# Patient Record
Sex: Female | Born: 1950 | Race: White | Hispanic: No | Smoking: Former smoker
Health system: Southern US, Community
[De-identification: ages and names within clinical notes are randomized; demographics above are authoritative.]

## PROBLEM LIST (undated history)

## (undated) DIAGNOSIS — E785 Hyperlipidemia, unspecified: Secondary | ICD-10-CM

## (undated) DIAGNOSIS — M810 Age-related osteoporosis without current pathological fracture: Secondary | ICD-10-CM

## (undated) DIAGNOSIS — Z87828 Personal history of other (healed) physical injury and trauma: Secondary | ICD-10-CM

## (undated) DIAGNOSIS — H269 Unspecified cataract: Secondary | ICD-10-CM

## (undated) DIAGNOSIS — E079 Disorder of thyroid, unspecified: Secondary | ICD-10-CM

## (undated) DIAGNOSIS — M1990 Unspecified osteoarthritis, unspecified site: Secondary | ICD-10-CM

## (undated) DIAGNOSIS — K219 Gastro-esophageal reflux disease without esophagitis: Secondary | ICD-10-CM

## (undated) DIAGNOSIS — T7840XA Allergy, unspecified, initial encounter: Secondary | ICD-10-CM

## (undated) HISTORY — PX: OTHER SURGICAL HISTORY: SHX169

## (undated) HISTORY — DX: Unspecified cataract: H26.9

## (undated) HISTORY — DX: Gastro-esophageal reflux disease without esophagitis: K21.9

## (undated) HISTORY — PX: POLYPECTOMY: SHX149

## (undated) HISTORY — DX: Allergy, unspecified, initial encounter: T78.40XA

## (undated) HISTORY — PX: CATARACT EXTRACTION: SUR2

## (undated) HISTORY — DX: Unspecified osteoarthritis, unspecified site: M19.90

## (undated) HISTORY — DX: Personal history of other (healed) physical injury and trauma: Z87.828

## (undated) HISTORY — DX: Age-related osteoporosis without current pathological fracture: M81.0

## (undated) HISTORY — PX: COLONOSCOPY: SHX174

## (undated) HISTORY — DX: Hyperlipidemia, unspecified: E78.5

## (undated) HISTORY — DX: Disorder of thyroid, unspecified: E07.9

---

## 1976-05-09 HISTORY — PX: BREAST ENHANCEMENT SURGERY: SHX7

## 1998-05-09 HISTORY — PX: KNEE SURGERY: SHX244

## 2000-01-14 ENCOUNTER — Encounter: Admission: RE | Admit: 2000-01-14 | Discharge: 2000-01-14 | Payer: Self-pay | Admitting: Surgery

## 2000-01-14 ENCOUNTER — Encounter: Payer: Self-pay | Admitting: Surgery

## 2000-01-17 ENCOUNTER — Ambulatory Visit (HOSPITAL_BASED_OUTPATIENT_CLINIC_OR_DEPARTMENT_OTHER): Admission: RE | Admit: 2000-01-17 | Discharge: 2000-01-17 | Payer: Self-pay | Admitting: Surgery

## 2002-06-17 ENCOUNTER — Encounter: Payer: Self-pay | Admitting: *Deleted

## 2002-06-17 ENCOUNTER — Ambulatory Visit (HOSPITAL_COMMUNITY): Admission: RE | Admit: 2002-06-17 | Discharge: 2002-06-17 | Payer: Self-pay | Admitting: *Deleted

## 2003-03-04 ENCOUNTER — Other Ambulatory Visit: Admission: RE | Admit: 2003-03-04 | Discharge: 2003-03-04 | Payer: Self-pay | Admitting: Obstetrics and Gynecology

## 2003-06-20 ENCOUNTER — Ambulatory Visit (HOSPITAL_BASED_OUTPATIENT_CLINIC_OR_DEPARTMENT_OTHER): Admission: RE | Admit: 2003-06-20 | Discharge: 2003-06-20 | Payer: Self-pay | Admitting: Surgery

## 2003-06-20 ENCOUNTER — Ambulatory Visit (HOSPITAL_COMMUNITY): Admission: RE | Admit: 2003-06-20 | Discharge: 2003-06-20 | Payer: Self-pay | Admitting: Surgery

## 2006-02-08 ENCOUNTER — Other Ambulatory Visit: Admission: RE | Admit: 2006-02-08 | Discharge: 2006-02-08 | Payer: Self-pay | Admitting: *Deleted

## 2012-06-19 ENCOUNTER — Other Ambulatory Visit: Payer: Self-pay | Admitting: Family Medicine

## 2012-06-19 ENCOUNTER — Ambulatory Visit
Admission: RE | Admit: 2012-06-19 | Discharge: 2012-06-19 | Disposition: A | Discharge status: Home / Self Care | Payer: 59 | Source: Ambulatory Visit | Attending: Family Medicine | Admitting: Family Medicine

## 2012-06-19 DIAGNOSIS — M25579 Pain in unspecified ankle and joints of unspecified foot: Secondary | ICD-10-CM

## 2013-04-03 ENCOUNTER — Other Ambulatory Visit (HOSPITAL_COMMUNITY)
Admission: RE | Admit: 2013-04-03 | Discharge: 2013-04-03 | Disposition: A | Discharge status: Home / Self Care | Payer: 59 | Source: Ambulatory Visit | Attending: Family Medicine | Admitting: Family Medicine

## 2013-04-03 ENCOUNTER — Other Ambulatory Visit: Payer: Self-pay | Admitting: Family Medicine

## 2013-04-03 DIAGNOSIS — Z0000 Encounter for general adult medical examination without abnormal findings: Secondary | ICD-10-CM | POA: Insufficient documentation

## 2013-04-08 ENCOUNTER — Encounter: Payer: Self-pay | Admitting: Gastroenterology

## 2013-05-30 ENCOUNTER — Encounter: Payer: Self-pay | Admitting: Gastroenterology

## 2013-05-30 ENCOUNTER — Ambulatory Visit (AMBULATORY_SURGERY_CENTER): Payer: Self-pay | Admitting: *Deleted

## 2013-05-30 VITALS — Ht 62.0 in | Wt 167.0 lb

## 2013-05-30 DIAGNOSIS — Z1211 Encounter for screening for malignant neoplasm of colon: Secondary | ICD-10-CM

## 2013-05-30 MED ORDER — MOVIPREP 100 G PO SOLR: 1.0000 | Freq: Once | ORAL | Status: DC

## 2013-05-30 NOTE — Progress Notes (Signed)
Denies allergies to eggs or soy products. Denies complications with sedation or anesthesia. 

## 2013-06-14 ENCOUNTER — Emergency Department (HOSPITAL_COMMUNITY)
Admission: EM | Admit: 2013-06-14 | Discharge: 2013-06-14 | Disposition: A | Discharge status: Home / Self Care | Payer: 59 | Attending: Emergency Medicine | Admitting: Emergency Medicine

## 2013-06-14 ENCOUNTER — Encounter: Payer: Self-pay | Admitting: Gastroenterology

## 2013-06-14 ENCOUNTER — Telehealth: Payer: Self-pay | Admitting: Gastroenterology

## 2013-06-14 ENCOUNTER — Encounter (HOSPITAL_COMMUNITY): Payer: Self-pay | Admitting: Emergency Medicine

## 2013-06-14 ENCOUNTER — Encounter (HOSPITAL_COMMUNITY)
Admission: EM | Disposition: A | Discharge status: Home / Self Care | Payer: 59 | Source: Home / Self Care | Attending: Emergency Medicine

## 2013-06-14 ENCOUNTER — Ambulatory Visit (AMBULATORY_SURGERY_CENTER): Payer: 59 | Admitting: Gastroenterology

## 2013-06-14 VITALS — BP 135/77 | HR 79 | Temp 96.4°F | Resp 18 | Ht 62.0 in | Wt 167.0 lb

## 2013-06-14 DIAGNOSIS — E785 Hyperlipidemia, unspecified: Secondary | ICD-10-CM | POA: Insufficient documentation

## 2013-06-14 DIAGNOSIS — D126 Benign neoplasm of colon, unspecified: Secondary | ICD-10-CM

## 2013-06-14 DIAGNOSIS — Z1211 Encounter for screening for malignant neoplasm of colon: Secondary | ICD-10-CM

## 2013-06-14 DIAGNOSIS — Z7982 Long term (current) use of aspirin: Secondary | ICD-10-CM | POA: Insufficient documentation

## 2013-06-14 DIAGNOSIS — Y849 Medical procedure, unspecified as the cause of abnormal reaction of the patient, or of later complication, without mention of misadventure at the time of the procedure: Secondary | ICD-10-CM | POA: Insufficient documentation

## 2013-06-14 DIAGNOSIS — Z87891 Personal history of nicotine dependence: Secondary | ICD-10-CM | POA: Insufficient documentation

## 2013-06-14 DIAGNOSIS — IMO0002 Reserved for concepts with insufficient information to code with codable children: Secondary | ICD-10-CM

## 2013-06-14 DIAGNOSIS — K625 Hemorrhage of anus and rectum: Secondary | ICD-10-CM

## 2013-06-14 HISTORY — PX: FLEXIBLE SIGMOIDOSCOPY: SHX5431

## 2013-06-14 LAB — CBC WITH DIFFERENTIAL/PLATELET
Basophils Absolute: 0.1 10*3/uL (ref 0.0–0.1)
Basophils Relative: 1 % (ref 0–1)
EOS ABS: 0.2 10*3/uL (ref 0.0–0.7)
Eosinophils Relative: 2 % (ref 0–5)
HCT: 39.8 % (ref 36.0–46.0)
Hemoglobin: 13.4 g/dL (ref 12.0–15.0)
LYMPHS ABS: 1.9 10*3/uL (ref 0.7–4.0)
LYMPHS PCT: 30 % (ref 12–46)
MCH: 31.6 pg (ref 26.0–34.0)
MCHC: 33.7 g/dL (ref 30.0–36.0)
MCV: 93.9 fL (ref 78.0–100.0)
MONOS PCT: 5 % (ref 3–12)
Monocytes Absolute: 0.3 10*3/uL (ref 0.1–1.0)
NEUTROS ABS: 4 10*3/uL (ref 1.7–7.7)
Neutrophils Relative %: 62 % (ref 43–77)
PLATELETS: 214 10*3/uL (ref 150–400)
RBC: 4.24 MIL/uL (ref 3.87–5.11)
RDW: 12.7 % (ref 11.5–15.5)
WBC: 6.4 10*3/uL (ref 4.0–10.5)

## 2013-06-14 LAB — BASIC METABOLIC PANEL
BUN: 8 mg/dL (ref 6–23)
CO2: 21 mEq/L (ref 19–32)
Calcium: 8.7 mg/dL (ref 8.4–10.5)
Chloride: 109 mEq/L (ref 96–112)
Creatinine, Ser: 0.65 mg/dL (ref 0.50–1.10)
GFR calc non Af Amer: >90 mL/min (ref >90)
GLUCOSE: 134 mg/dL — AB (ref 70–99)
POTASSIUM: 4 meq/L (ref 3.7–5.3)
SODIUM: 143 meq/L (ref 137–147)

## 2013-06-14 SURGERY — SIGMOIDOSCOPY, FLEXIBLE
Anesthesia: Moderate Sedation

## 2013-06-14 MED ORDER — SODIUM CHLORIDE 0.9 % IV SOLN: INTRAVENOUS | Status: DC

## 2013-06-14 MED ORDER — FLEET ENEMA 7-19 GM/118ML RE ENEM: 1.0000 | ENEMA | Freq: Once | RECTAL | Status: DC

## 2013-06-14 MED ORDER — SODIUM CHLORIDE 0.9 % IV SOLN: 500.0000 mL | INTRAVENOUS | Status: DC

## 2013-06-14 MED ORDER — FLEET ENEMA 7-19 GM/118ML RE ENEM
ENEMA | RECTAL | Status: AC
  Filled 2013-06-14: qty 1

## 2013-06-14 NOTE — Progress Notes (Signed)
Called to room to assist during endoscopic procedure.  Patient ID and intended procedure confirmed with present staff. Received instructions for my participation in the procedure from the performing physician.  

## 2013-06-14 NOTE — ED Provider Notes (Signed)
CSN: [id number]     Arrival date & time 06/14/13  1140 History   First MD Initiated Contact with Patient 06/14/13 1149     Chief Complaint  Patient presents with  . Rectal Bleeding   (Consider location/radiation/quality/duration/timing/severity/associated sxs/prior Treatment) HPI Patient reports she had a routine colonoscopy done at 9 AM this morning. She states she's never had one before. She did not have any precipitating problems. She states she had 4 polyps removed. She states when she went home she had the urge to have a bowel movement and passed a lot of liquid however it was grossly bloody. She did pass blood 2 more times after that. She has some mild lower abdominal bloating but denies pain. She denies nausea, vomiting, feeling dizzy, feeling lightheaded. She states she's never had rectal bleeding before.  PCP Dr Earney Mallet GI Dr Fuller Plan  Past Medical History  Diagnosis Date  . Hyperlipidemia   . 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 Breast enhancement surgery  1978  . Knee surgery Right 2000   Family History  Problem Relation Age of Onset  . Colon cancer Neg Hx   . Esophageal cancer Neg Hx   . Rectal cancer Neg Hx   . Stomach cancer Neg Hx    History  Substance Use Topics  . Smoking status: Former Smoker    Quit date: 05/09/1990  . Smokeless tobacco: Never Used  . Alcohol Use: 0.6 oz/week    1 Cans of beer per week   patient drinks 3-4 beers per week however she has not had any in 2 weeks Patient employed   OB History   Grav Para Term Preterm Abortions TAB SAB Ect Mult Living                 Review of Systems  All other systems reviewed and are negative.    Allergies  Review of patient's allergies indicates no known allergies.  Home Medications   Current Outpatient Rx  Name  Route  Sig  Dispense  Refill  . ascorbic acid (VITAMIN C) 1000 MG tablet   Oral   Take 1,000 mg by mouth daily.         Marland Kitchen aspirin 81  MG tablet   Oral   Take 81 mg by mouth daily.         Marland Kitchen atorvastatin (LIPITOR) 20 MG tablet   Oral   Take 20 mg by mouth daily at 6 PM.          . doxylamine, Sleep, (UNISOM) 25 MG tablet   Oral   Take 25 mg by mouth at bedtime.          . gabapentin (NEURONTIN) 300 MG capsule   Oral   Take 300 mg by mouth 3 (three) times daily.         Marland Kitchen omeprazole (PRILOSEC) 20 MG capsule   Oral   Take 20 mg by mouth daily.          BP 147/80  Pulse 85  Temp(Src) 97.7 F (36.5 C) (Oral)  Resp 14  SpO2 96%  Vital signs normal   Orthostatic vital signs are normal  Physical Exam  Nursing note and vitals reviewed. Constitutional: She is oriented to person, place, and time. She appears well-developed and well-nourished.  Non-toxic appearance. She does not appear ill. No distress.  HENT:  Head: Normocephalic and atraumatic.  Right Ear: External ear normal.  Left  Ear: External ear normal.  Nose: Nose normal. No mucosal edema or rhinorrhea.  Mouth/Throat: Oropharynx is clear and moist and mucous membranes are normal. No dental abscesses or uvula swelling.  Eyes: Conjunctivae and EOM are normal. Pupils are equal, round, and reactive to light.  Conjunctiva are not pale  Neck: Normal range of motion and full passive range of motion without pain. Neck supple.  Cardiovascular: Normal rate, regular rhythm and normal heart sounds.  Exam reveals no gallop and no friction rub.   No murmur heard. Pulmonary/Chest: Effort normal and breath sounds normal. No respiratory distress. She has no wheezes. She has no rhonchi. She has no rales. She exhibits no tenderness and no crepitus.  Abdominal: Soft. Normal appearance and bowel sounds are normal. She exhibits no distension. There is no tenderness. There is no rebound and no guarding.  Genitourinary:  Rectal done by GI PA is grossly bloody  Musculoskeletal: Normal range of motion. She exhibits no edema and no tenderness.  Moves all extremities  well.   Neurological: She is alert and oriented to person, place, and time. She has normal strength. No cranial nerve deficit.  Skin: Skin is warm, dry and intact. No rash noted. No erythema. No pallor.  Psychiatric: She has a normal mood and affect. Her speech is normal and behavior is normal. Her mood appears not anxious.    ED Course  Procedures (including critical care time)  Pt seen by the GI PA, they are going to take her to the OR to clip the site of the rectal polyp that was removed earlier today, which they feel is the most likely site of the bleeding.   Labs Review Results for orders placed during the hospital encounter of 06/14/13  CBC WITH DIFFERENTIAL      Result Value Range   WBC 6.4  4.0 - 10.5 K/uL   RBC 4.24  3.87 - 5.11 MIL/uL   Hemoglobin 13.4  12.0 - 15.0 g/dL   HCT 39.8  36.0 - 46.0 %   MCV 93.9  78.0 - 100.0 fL   MCH 31.6  26.0 - 34.0 pg   MCHC 33.7  30.0 - 36.0 g/dL   RDW 12.7  11.5 - 15.5 %   Platelets 214  150 - 400 K/uL   Neutrophils Relative % 62  43 - 77 %   Neutro Abs 4.0  1.7 - 7.7 K/uL   Lymphocytes Relative 30  12 - 46 %   Lymphs Abs 1.9  0.7 - 4.0 K/uL   Monocytes Relative 5  3 - 12 %   Monocytes Absolute 0.3  0.1 - 1.0 K/uL   Eosinophils Relative 2  0 - 5 %   Eosinophils Absolute 0.2  0.0 - 0.7 K/uL   Basophils Relative 1  0 - 1 %   Basophils Absolute 0.1  0.0 - 0.1 K/uL  BASIC METABOLIC PANEL      Result Value Range   Sodium 143  137 - 147 mEq/L   Potassium 4.0  3.7 - 5.3 mEq/L   Chloride 109  96 - 112 mEq/L   CO2 21  19 - 32 mEq/L   Glucose, Bld 134 (*) 70 - 99 mg/dL   BUN 8  6 - 23 mg/dL   Creatinine, Ser 0.65  0.50 - 1.10 mg/dL   Calcium 8.7  8.4 - 10.5 mg/dL   GFR calc non Af Amer >90  >90 mL/min   GFR calc Af Amer >90  >90 mL/min  Laboratory interpretation all normal    Imaging Review No results found.  EKG Interpretation   None       MDM   1. Post-op bleeding   2. Bright red rectal bleeding     Disposition per  GI   Rolland Porter, MD, [facility]    Janice Norrie, MD 06/14/13 [phone]

## 2013-06-14 NOTE — Telephone Encounter (Signed)
Patient stated that she has had a lot of bleeding from her rectum when she goes to the bathroom.   She said that the toilet was full of blood.  Dr. Fuller Plan wants her to go to [facility] ER.  Nevin Bloodgood will see her.

## 2013-06-14 NOTE — Consult Note (Signed)
Consultation  Referring Provider: Emergency Department      Primary Care Physician:  Shirline Frees, MD Primary Gastroenterologist:  Freda Jackson       Reason for Consultation: rectal bleeding             HPI:   Mikayla Golden is a 63 y.o. female who is s/p screening colonoscopy with polyppectomy this am by Dr. Fuller Plan. Three small, sessile polyps were removed from descending colon via cold snare  / cold forceps. An 71mm semi-pedunculated was removed from rectum with cold snare. Shortly after arriving home post-procedure patient passed a large amount of bright red blood. She has since had two more episodes. No abdominal pain. No SOB or dizziness. Vitals stable. She takes a baby asa, no other NSAIDs. No blood thinner use.  Past Medical History  Diagnosis Date  . Hyperlipidemia   . 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 Breast enhancement surgery  1978  . Knee surgery Right 2000    Family History  Problem Relation Age of Onset  . Colon cancer Neg Hx   . Esophageal cancer Neg Hx   . Rectal cancer Neg Hx   . Stomach cancer Neg Hx      History  Substance Use Topics  . Smoking status: Former Smoker    Quit date: 05/09/1990  . Smokeless tobacco: Never Used  . Alcohol Use: 0.6 oz/week    1 Cans of beer per week    Prior to Admission medications   Medication Sig Start Date End Date Taking? Authorizing Provider  ascorbic acid (VITAMIN C) 1000 MG tablet Take 1,000 mg by mouth daily.   Yes Historical Provider, MD  aspirin 81 MG tablet Take 81 mg by mouth daily.   Yes Historical Provider, MD  atorvastatin (LIPITOR) 20 MG tablet Take 20 mg by mouth daily at 6 PM.    Yes Historical Provider, MD  doxylamine, Sleep, (UNISOM) 25 MG tablet Take 25 mg by mouth at bedtime.    Yes Historical Provider, MD  gabapentin (NEURONTIN) 300 MG capsule Take 300 mg by mouth 3 (three) times daily.   Yes Historical Provider, MD  omeprazole (PRILOSEC) 20  MG capsule Take 20 mg by mouth daily.   Yes Historical Provider, MD    No current facility-administered medications for this encounter.   Current Outpatient Prescriptions  Medication Sig Dispense Refill  . ascorbic acid (VITAMIN C) 1000 MG tablet Take 1,000 mg by mouth daily.      Marland Kitchen aspirin 81 MG tablet Take 81 mg by mouth daily.      Marland Kitchen atorvastatin (LIPITOR) 20 MG tablet Take 20 mg by mouth daily at 6 PM.       . doxylamine, Sleep, (UNISOM) 25 MG tablet Take 25 mg by mouth at bedtime.       . gabapentin (NEURONTIN) 300 MG capsule Take 300 mg by mouth 3 (three) times daily.      Marland Kitchen omeprazole (PRILOSEC) 20 MG capsule Take 20 mg by mouth daily.        Allergies as of 06/14/2013  . (No Known Allergies)    Review of Systems:    All systems reviewed and negative except where noted in HPI.   Physical Exam:  Vital signs in last 24 hours: Temp:  [96.4 F (35.8 C)-97.7 F (36.5 C)] 97.7 F (36.5 C) (02/06 1150) Pulse Rate:  [78-94] 94 (02/06 1150) Resp:  [12-21]  14 (02/06 1150) BP: (114-146)/(69-98) 137/87 mmHg (02/06 1150) SpO2:  [92 %-100 %] 96 % (02/06 1150) Weight:  [167 lb (75.751 kg)] 167 lb (75.751 kg) (02/06 0825)   General:   Pleasant white female in NAD Head:  Normocephalic and atraumatic. Eyes:   No icterus.   Conjunctiva pink. Ears:  Normal auditory acuity. Neck:  Supple; no masses felt Lungs:  Respirations even and unlabored. Lungs clear to auscultation bilaterally.   No wheezes, crackles, or rhonchi.  Heart:  Regular rate and rhythm;  murmur heard. Abdomen:  Soft, nondistended, nontender. Normal bowel sounds. No appreciable masses or hepatomegaly.  Rectal:  Small amount bright red blood on DRE.   Msk:  Symmetrical without gross deformities.  Extremities:  Without edema. Neurologic:  Alert and  oriented x4;  grossly normal neurologically. Skin:  Intact without significant lesions or rashes. Cervical Nodes:  No significant cervical adenopathy. Psych:  Alert and  cooperative. Normal affect.  LAB RESULTS:  Recent Labs  06/14/13 1205  WBC 6.4  HGB 13.4  HCT 39.8  PLT 214    PREVIOUS ENDOSCOPIES:            Colonoscopy with polypectomy this am, see HPI   Impression / Plan:   63 year old female presenting with painless hematochezia following colonoscopy with polypectomy this am.. Suspect post-polypectomy bleed. She is hemodynamically stable, presenting hgb 14.2. Culprit lesions likely the 23mm semi-pedunculated rectal polyp though it was removed with cold snare. Will likely need flexible sigmoidoscopy today for further evaluation and treatment. Keep NPO.   Thanks   LOS: 0 days   [name]  06/14/2013, 12:40 PM

## 2013-06-14 NOTE — Progress Notes (Signed)
Procedure ends, to recovery, report given and VSS. 

## 2013-06-14 NOTE — Consult Note (Signed)
Patient seen, examined, and I agree with the above documentation, including the assessment and plan. Proceed to unpreppred flex sig for control of post-polypectomy bleeding The nature of the procedure, as well as the risks, benefits, and alternatives were carefully and thoroughly reviewed with the patient. Ample time for discussion and questions allowed. The patient understood, was satisfied, and agreed to proceed.

## 2013-06-14 NOTE — ED Notes (Signed)
Pt had colonoscopy at 9am; started having rectal bleeding when she got home; told to come to ER by Dr Johny Shock; minimal abd pain

## 2013-06-14 NOTE — Op Note (Signed)
[place]  [address] [place], [id number]   COLONOSCOPY PROCEDURE REPORT  PATIENT: Mikayla, Golden  MR#: [id number] BIRTHDATE: [birth year] , 62  yrs. old GENDER: Female ENDOSCOPIST: Ladene Artist, MD, [facility] REFERRED IR:SWNIOEV Kenton Kingfisher, M.D. PROCEDURE DATE:  06/14/2013 PROCEDURE:   Colonoscopy with biopsy and snare polypectomy First Screening Colonoscopy - Avg.  risk and is 50 yrs.  old or older Yes.  Prior Negative Screening - Now for repeat screening. N/A  History of Adenoma - Now for follow-up colonoscopy & has been > or = to 3 yrs.  N/A  Polyps Removed Today? Yes. ASA CLASS:   Class II INDICATIONS:average risk screening. MEDICATIONS: MAC sedation, administered by CRNA and propofol (Diprivan) 230mg  IV DESCRIPTION OF PROCEDURE:   After the risks benefits and alternatives of the procedure were thoroughly explained, informed consent was obtained.  A digital rectal exam revealed no abnormalities of the rectum.   The LB [id number]  endoscope was introduced through the anus and advanced to the cecum, which was identified by both the appendix and ileocecal valve. No adverse events experienced.   The quality of the prep was excellent, using MoviPrep  The instrument was then slowly withdrawn as the colon was fully examined.  COLON FINDINGS: Three sessile polyps ranging between 3-5 mm in size were found in the descending colon.  A polypectomy was performed with a cold snare and with cold forceps.  The resection was complete and the polyp tissue was completely retrieved.   A semi-pedunculated polyp measuring 8 mm in size was found in the rectum.  A polypectomy was performed with a cold snare.  The resection was complete and the polyp tissue was completely retrieved.   The colon was otherwise normal.  There was no diverticulosis, inflammation, polyps or cancers unless previously stated.  Retroflexed views revealed no abnormalities. The time to cecum=2 minutes  59 seconds.  Withdrawal time=11 minutes 29 seconds. The scope was withdrawn and the procedure completed. COMPLICATIONS: There were no complications.  ENDOSCOPIC IMPRESSION: 1.   Three sessile polyps ranging between 3-51mm in the descending colon; polypectomy performed with a cold snare and with cold forceps 2.   Semi-pedunculated polyp measuring 8 mm in the rectum; polypectomy performed with a cold snare  RECOMMENDATIONS: 1.  Await pathology results 2.  Repeat colonoscopy in 5 years if polyp(s) adenomatous; otherwise 10 years  eSigned:  Ladene Artist, MD, [facility] 06/14/2013 9:37 AM

## 2013-06-14 NOTE — Discharge Instructions (Addendum)
Flexible Sigmoidoscopy Your caregiver has ordered a flexible sigmoidoscopy. This is an exam to evaluate your lower colon. In this exam your colon is cleansed and a short fiber optic tube is inserted through your rectum and into your colon. The fiber optic scope (endoscope) is a short bundle of enclosed flexible small glass fibers. It transmits light to the area examined and images from that area to your caregiver. You do not have to worry about glass breakage in the endoscope. Discomfort is usually minimal. Sedatives and pain medications are generally not required. This exam helps to detect tumors (lumps), polyps, inflammation (swelling and soreness), and areas of bleeding. It may also be used to take biopsies. These are small pieces of tissue taken to examine under a microscope. LET YOUR CAREGIVER KNOW ABOUT:  Allergies.  Medications taken including herbs, eye drops, over the counter medications, and creams.  Use of steroids (by mouth or creams).  Previous problems with anesthetics or novocaine  Possibility of pregnancy, if this applies.  History of blood clots (thrombophlebitis).  History of bleeding or blood problems.  Previous surgery.  Other health problems. BEFORE THE PROCEDURE Eat normally the night before the exam. Your caregiver may order a mild enema or laxative the night before. No eating or drinking should occur after midnight until the procedure is completed. A rectal suppository or enemas may be given in the morning prior to your procedure. You will be brought to the examination area in a hospital gown. You should be present 60 minutes prior to your procedure or as directed.  AFTER THE PROCEDURE   There is sometimes a little blood passed with the first bowel movement. Do not be concerned. Because air is often used during the exam, it is not unusual to pass gas and experience abdominal (belly) cramping. Walking or a warm pack on your abdomen may help with this. Do not sleep  with a heating pad as burns can occur.  You may resume all normal eating and activities.  Only take over-the-counter or prescription medicines for pain, discomfort, or fever as directed by your caregiver. Do not use aspirin or blood thinners if a biopsy (tissue sample) was taken. Consult your caregiver for medication usage if biopsies were taken.  Call for your results as instructed by your caregiver. Remember, it is your responsibility to obtain the results of your biopsy. Do not assume everything is fine because you do not hear from your caregiver. SEEK IMMEDIATE MEDICAL CARE IF:  An oral temperature above 102 F (38.9 C) develops.  You pass large blood clots or fill a toilet with blood following the procedure. This may also occur 10 to 14 days following the procedure. It is more likely if a biopsy was taken.  You develop abdominal pain not relieved with medication or that is getting worse rather than better. Document Released: 04/22/2000 Document Revised: 07/18/2011 Document Reviewed: 02/02/2005 Exit[employer] Patient Information 2014 [employer]   YOU HAD AN ENDOSCOPIC PROCEDURE TODAY: Refer to the procedure report that was given to you for any specific questions about what was found during the examination.  If the procedure report does not answer your questions, please call your gastroenterologist to clarify.  YOU SHOULD EXPECT: Some feelings of bloating in the abdomen. Passage of more gas than usual.  Walking can help get rid of the air that was put into your GI tract during the procedure and reduce the bloating. If you had a lower endoscopy (such as a colonoscopy or flexible sigmoidoscopy) you  may notice spotting of blood in your stool or on the toilet paper.   DIET: Your first meal following the procedure should be a light meal and then it is ok to progress to your normal diet.  A half-sandwich or bowl of soup is an example of a good first meal.  Heavy or fried foods are harder to  digest and may make you feel nasueas or bloated.  Drink plenty of fluids but you should avoid alcoholic beverages for 24 hours.  ACTIVITY: Your care partner should take you home directly after the procedure.  You should plan to take it easy, moving slowly for the rest of the day.  You can resume normal activity the day after the procedure however you should NOT DRIVE or use heavy machinery for 24 hours (because of the sedation medicines used during the test).    SYMPTOMS TO REPORT IMMEDIATELY  A gastroenterologist can be reached at any hour.  Please call your doctor's office for any of the following symptoms:   Following lower endoscopy (colonoscopy, flexible sigmoidoscopy)  Excessive amounts of blood in the stool  Significant tenderness, worsening of abdominal pains  Swelling of the abdomen that is new, acute  Fever of 100 or higher  Following upper endoscopy (EGD, EUS, ERCP)  Vomiting of blood or coffee ground material  New, significant abdominal pain  New, significant chest pain or pain under the shoulder blades  Painful or persistently difficult swallowing  New shortness of breath  Black, tarry-looking stools  FOLLOW UP: If any biopsies were taken you will be contacted by phone or by letter within the next 1-3 weeks.  Call your gastroenterologist if you have not heard about the biopsies in 3 weeks.  Please also call your gastroenterologist's office with any specific questions about appointments or follow up tests.

## 2013-06-14 NOTE — Patient Instructions (Signed)
YOU HAD AN ENDOSCOPIC PROCEDURE TODAY AT [facility]: Refer to the procedure report that was given to you for any specific questions about what was found during the examination.  If the procedure report does not answer your questions, please call your gastroenterologist to clarify.  If you requested that your care partner not be given the details of your procedure findings, then the procedure report has been included in a sealed envelope for you to review at your convenience later.  YOU SHOULD EXPECT: Some feelings of bloating in the abdomen. Passage of more gas than usual.  Walking can help get rid of the air that was put into your GI tract during the procedure and reduce the bloating. If you had a lower endoscopy (such as a colonoscopy or flexible sigmoidoscopy) you may notice spotting of blood in your stool or on the toilet paper. If you underwent a bowel prep for your procedure, then you may not have a normal bowel movement for a few days.  DIET: Your first meal following the procedure should be a light meal and then it is ok to progress to your normal diet.  A half-sandwich or bowl of soup is an example of a good first meal.  Heavy or fried foods are harder to digest and may make you feel nauseous or bloated.  Likewise meals heavy in dairy and vegetables can cause extra gas to form and this can also increase the bloating.  Drink plenty of fluids but you should avoid alcoholic beverages for 24 hours.  iNCREASE THE FIBER IN YOUR DIET.  ACTIVITY: Your care partner should take you home directly after the procedure.  You should plan to take it easy, moving slowly for the rest of the day.  You can resume normal activity the day after the procedure however you should NOT DRIVE or use heavy machinery for 24 hours (because of the sedation medicines used during the test).    SYMPTOMS TO REPORT IMMEDIATELY: A gastroenterologist can be reached at any hour.  During normal business hours, 8:30 AM to  5:00 PM Monday through Friday, call [phone].  After hours and on weekends, please call the GI answering service at [phone] who will take a message and have the physician on call contact you.   Following lower endoscopy (colonoscopy or flexible sigmoidoscopy):  Excessive amounts of blood in the stool  Significant tenderness or worsening of abdominal pains  Swelling of the abdomen that is new, acute  Fever of 100F or higher  FOLLOW UP: If any biopsies were taken you will be contacted by phone or by letter within the next 1-3 weeks.  Call your gastroenterologist if you have not heard about the biopsies in 3 weeks.  Our staff will call the home number listed on your records the next business day following your procedure to check on you and address any questions or concerns that you may have at that time regarding the information given to you following your procedure. This is a courtesy call and so if there is no answer at the home number and we have not heard from you through the emergency physician on call, we will assume that you have returned to your regular daily activities without incident.  SIGNATURES/CONFIDENTIALITY: You and/or your care partner have signed paperwork which will be entered into your electronic medical record.  These signatures attest to the fact that that the information above on your After Visit Summary has been reviewed and is understood.  Full responsibility of the confidentiality of this discharge information lies with you and/or your care-partner.

## 2013-06-14 NOTE — Op Note (Signed)
[facility] [place], [address]   FLEXIBLE SIGMOIDOSCOPY PROCEDURE REPORT  PATIENT: Mikayla Golden, Mikayla Golden  MR#: [id number] BIRTHDATE: [birth year] , 62  yrs. old GENDER: Female ENDOSCOPIST: Jerene Bears, MD REFERRED BY: Ladene Artist, M.D, [facility] PROCEDURE DATE:  06/14/2013 PROCEDURE:   Sigmoidoscopy with control of bleeding ASA CLASS:   Class II INDICATIONS:rectal bleeding.   post-polypectomy from colonoscopy earlier today (8 mm rectal polyp removed cold snare, 3-5 mm descending polyp x 3 removed cold forceps and cold snare). MEDICATIONS: None  DESCRIPTION OF PROCEDURE:   After the risks benefits and alternatives of the procedure were thoroughly explained, informed consent was obtained.  revealed no abnormalities of the rectum. The Pentax adult upper endoscope was introduced through the anus  and advanced to the sigmoid colon , limited by No adverse events experienced.   The quality of the prep was adequate .  The instrument was then slowly withdrawn as the mucosa was fully examined.    COLON FINDINGS: Blood in the rectum, irrigation and lavage performed. Post-polypectomy ulceration with adherent clot in the rectum, at the 1st rectal valve. The adherent clot was removed with suction, and 2 hemostatic clips were placed under the post-polypectomy ulceration with success. There was no bleeding at the end of the procedure.  The scope was advanced into the mid to proximal sigmoid, but was unable to be advanced to the descending colon due to patient discomfort.  The descending polypectomy sites were not visualized, but no blood was seen washing from the more proximal segment of colon.    Retroflexion was not performed. The scope was then withdrawn from the patient and the procedure terminated.  COMPLICATIONS: There were no complications.  ENDOSCOPIC IMPRESSION: 1.   Blood in the rectum, irrigation and lavage performed. 2.   Post-polypectomy ulceration  with adherent clot, clot removed and 2 hemostatic clips placed with success.  RECOMMENDATIONS: 1.  Monitor for further bleeding.  Contact the on-call provider if this occurs over the weekend 2.  Patient to followup with Dr.  Fuller Plan  eSigned:  Jerene Bears, MD 06/14/2013 4:36 PM  CC:The Patient Lucio Edward, MD

## 2013-06-17 ENCOUNTER — Telehealth: Payer: Self-pay

## 2013-06-17 ENCOUNTER — Encounter (HOSPITAL_COMMUNITY): Payer: Self-pay | Admitting: Internal Medicine

## 2013-06-17 NOTE — Telephone Encounter (Signed)
Left message on answering machine. 

## 2013-06-20 ENCOUNTER — Encounter: Payer: Self-pay | Admitting: Gastroenterology

## 2013-06-20 ENCOUNTER — Telehealth: Payer: Self-pay | Admitting: *Deleted

## 2013-06-20 NOTE — Telephone Encounter (Signed)
lmom for pt to call back. She needs a f/u appt with Dr Fuller Plan.

## 2013-06-20 NOTE — Telephone Encounter (Signed)
lmom for pt to call back. Scheduled her for 06/28/13 at 09:45am

## 2013-06-20 NOTE — Telephone Encounter (Signed)
Informed pt of the appt and she states she does not need to come back; she is not bleeding. Pt states she will call if she has problems,

## 2013-06-28 ENCOUNTER — Ambulatory Visit: Payer: 59 | Admitting: Gastroenterology

## 2014-07-28 ENCOUNTER — Emergency Department (HOSPITAL_COMMUNITY): Payer: BLUE CROSS/BLUE SHIELD

## 2014-07-28 ENCOUNTER — Emergency Department (HOSPITAL_COMMUNITY)
Admission: EM | Admit: 2014-07-28 | Discharge: 2014-07-28 | Disposition: A | Discharge status: Home / Self Care | Payer: BLUE CROSS/BLUE SHIELD | Attending: Emergency Medicine | Admitting: Emergency Medicine

## 2014-07-28 ENCOUNTER — Encounter (HOSPITAL_COMMUNITY): Payer: Self-pay

## 2014-07-28 DIAGNOSIS — E785 Hyperlipidemia, unspecified: Secondary | ICD-10-CM | POA: Insufficient documentation

## 2014-07-28 DIAGNOSIS — Z79899 Other long term (current) drug therapy: Secondary | ICD-10-CM | POA: Diagnosis not present

## 2014-07-28 DIAGNOSIS — Z87891 Personal history of nicotine dependence: Secondary | ICD-10-CM | POA: Insufficient documentation

## 2014-07-28 DIAGNOSIS — R05 Cough: Secondary | ICD-10-CM | POA: Diagnosis not present

## 2014-07-28 DIAGNOSIS — R0981 Nasal congestion: Secondary | ICD-10-CM | POA: Diagnosis not present

## 2014-07-28 DIAGNOSIS — Z87828 Personal history of other (healed) physical injury and trauma: Secondary | ICD-10-CM | POA: Diagnosis not present

## 2014-07-28 DIAGNOSIS — R059 Cough, unspecified: Secondary | ICD-10-CM

## 2014-07-28 MED ORDER — ALBUTEROL SULFATE (2.5 MG/3ML) 0.083% IN NEBU
5.0000 mg | INHALATION_SOLUTION | Freq: Once | RESPIRATORY_TRACT | Status: AC
  Administered 2014-07-28: 5 mg via RESPIRATORY_TRACT
  Filled 2014-07-28: qty 6

## 2014-07-28 MED ORDER — GUAIFENESIN ER 600 MG PO TB12
600.0000 mg | ORAL_TABLET | Freq: Two times a day (BID) | ORAL | Status: DC
Start: 2014-07-28 — End: 2017-05-04

## 2014-07-28 MED ORDER — BENZONATATE 100 MG PO CAPS: 100.0000 mg | ORAL_CAPSULE | Freq: Three times a day (TID) | ORAL | Status: DC | PRN

## 2014-07-28 NOTE — ED Notes (Signed)
Pt seen at PCP last Tuesday [place] for Bronchitis and given RX. To be seen again on Friday if not better. Did not return. Called today for no improvement was told to come here for further evaluation. Denies fever N/V/D and CP and SOB

## 2014-07-28 NOTE — ED Provider Notes (Signed)
CSN: [id number]     Arrival date & time 07/28/14  2202 History   First MD Initiated Contact with Patient 07/28/14 1004     No chief complaint on file.    (Consider location/radiation/quality/duration/timing/severity/associated sxs/prior Treatment) Patient is a 64 y.o. female presenting with cough. The history is provided by the patient.  Cough Cough characteristics:  Productive Sputum characteristics:  White Severity:  Mild Onset quality:  Gradual Duration:  1 week Timing:  Constant Progression:  Improving Chronicity:  New Smoker: no   Context: upper respiratory infection   Relieved by:  Nothing Worsened by:  Nothing tried Ineffective treatments:  None tried Associated symptoms: no chest pain, no fever, no headaches and no shortness of breath     Past Medical History  Diagnosis Date  . Hyperlipidemia   . 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 Breast enhancement surgery  1978  . Knee surgery Right 2000  . Flexible sigmoidoscopy N/A 06/14/2013    Procedure: FLEXIBLE SIGMOIDOSCOPY;  Surgeon: Jerene Bears, MD;  Location: [facility];  Service: Endoscopy;  Laterality: N/A;   Family History  Problem Relation Age of Onset  . Colon cancer Neg Hx   . Esophageal cancer Neg Hx   . Rectal cancer Neg Hx   . Stomach cancer Neg Hx    History  Substance Use Topics  . Smoking status: Former Smoker    Quit date: 05/09/1990  . Smokeless tobacco: Never Used  . Alcohol Use: 0.6 oz/week    1 Cans of beer per week   OB History    No data available     Review of Systems  Constitutional: Negative for fever and fatigue.  HENT: Positive for congestion. Negative for drooling.   Eyes: Negative for pain.  Respiratory: Positive for cough. Negative for shortness of breath.   Cardiovascular: Negative for chest pain.  Gastrointestinal: Negative for nausea, vomiting, abdominal pain and diarrhea.  Genitourinary: Negative for dysuria and hematuria.   Musculoskeletal: Negative for back pain, gait problem and neck pain.  Skin: Negative for color change.  Neurological: Negative for dizziness and headaches.  Hematological: Negative for adenopathy.  Psychiatric/Behavioral: Negative for behavioral problems.  All other systems reviewed and are negative.     Allergies  Review of patient's allergies indicates no known allergies.  Home Medications   Prior to Admission medications   Medication Sig Start Date End Date Taking? Authorizing Provider  atorvastatin (LIPITOR) 20 MG tablet Take 20 mg by mouth daily at 6 PM.    Yes Historical Provider, MD  benzonatate (TESSALON) 100 MG capsule Take 200 mg by mouth 3 (three) times daily as needed for cough.   Yes Historical Provider, MD  doxylamine, Sleep, (UNISOM) 25 MG tablet Take 25 mg by mouth at bedtime as needed for sleep.    Yes Historical Provider, MD  gabapentin (NEURONTIN) 300 MG capsule Take 300 mg by mouth at bedtime.    Yes Historical Provider, MD  levofloxacin (LEVAQUIN) 500 MG tablet Take 500 mg by mouth daily. For 10 days 07/22/14  Yes Historical Provider, MD  omeprazole (PRILOSEC) 20 MG capsule Take 20 mg by mouth daily.   Yes Historical Provider, MD   BP 144/96 mmHg  Pulse 107  Temp(Src) 97.9 F (36.6 C) (Oral)  Resp 18  Ht 5\' 2"  (1.575 m)  Wt 160 lb (72.576 kg)  BMI 29.26 kg/m2  SpO2 97% Physical Exam  Constitutional: She is oriented to person,  place, and time. She appears well-developed and well-nourished.  HENT:  Head: Normocephalic.  Mouth/Throat: Oropharynx is clear and moist. No oropharyngeal exudate.  Eyes: Conjunctivae and EOM are normal. Pupils are equal, round, and reactive to light.  Neck: Normal range of motion. Neck supple.  Cardiovascular: Normal rate, regular rhythm, normal heart sounds and intact distal pulses.  Exam reveals no gallop and no friction rub.   No murmur heard. Pulmonary/Chest: Effort normal and breath sounds normal. No respiratory distress.  She has no wheezes.  Abdominal: Soft. Bowel sounds are normal. There is no tenderness. There is no rebound and no guarding.  Musculoskeletal: Normal range of motion. She exhibits no edema or tenderness.  Neurological: She is alert and oriented to person, place, and time.  Skin: Skin is warm and dry.  Psychiatric: She has a normal mood and affect. Her behavior is normal.  Nursing note and vitals reviewed.   ED Course  Procedures (including critical care time) Labs Review Labs Reviewed - No data to display  Imaging Review Dg Chest 2 View  07/28/2014   CLINICAL DATA:  Chest congestion for 6 days  EXAM: CHEST  2 VIEW  COMPARISON:  None.  FINDINGS: Cardiac shadow is within normal limits. The lungs are well aerated bilaterally. Calcified breast implant is noted on the left. No bony abnormality is seen.  IMPRESSION: No active cardiopulmonary disease.   Electronically Signed   By: Inez Catalina M.D.   On: 07/28/2014 10:56     EKG Interpretation None      MDM   Final diagnoses:  Cough    10:40 AM 64 y.o. female who presents with cough and congestion which began approximately 7-10 days ago. Cough is productive of whitish sputum. She denies any fevers. Saw her PCP at the beginning of last week who diagnosed her with bronchitis and started her on Levaquin. She is on day 7 of 10 of Levaquin. She notes mild improvement has continued cough. She states that her PCP recommended she come here for a chest x-ray. She otherwise denies any chest pain or shortness of breath. Her vital signs are unremarkable here. We'll get screening plain film chest.  11:49 AM: I interpreted/reviewed the labs and/or imaging which were non-contributory.   I have discussed the diagnosis/risks/treatment options with the patient and believe the pt to be eligible for discharge home to follow-up with her pcp as needed. We also discussed returning to the ED immediately if new or worsening sx occur. We discussed the sx which are  most concerning (e.g., sob, cp, fever) that necessitate immediate return. Medications administered to the patient during their visit and any new prescriptions provided to the patient are listed below.  Medications given during this visit Medications  albuterol (PROVENTIL) (2.5 MG/3ML) 0.083% nebulizer solution 5 mg (not administered)    New Prescriptions   BENZONATATE (TESSALON) 100 MG CAPSULE    Take 1 capsule (100 mg total) by mouth 3 (three) times daily as needed for cough.   GUAIFENESIN (MUCINEX) 600 MG 12 HR TABLET    Take 1 tablet (600 mg total) by mouth 2 (two) times daily.     Pamella Pert, MD 07/28/14 1149

## 2014-07-28 NOTE — Discharge Instructions (Signed)
Cough, Adult   A cough is a reflex. It helps you clear your throat and airways. A cough can help heal your body. A cough can last 2 or 3 weeks (acute) or may last more than 8 weeks (chronic). Some common causes of a cough can include an infection, allergy, or a cold.  HOME CARE  · Only take medicine as told by your doctor.  · If given, take your medicines (antibiotics) as told. Finish them even if you start to feel better.  · Use a cold steam vaporizer or humidifier in your home. This can help loosen thick spit (secretions).  · Sleep so you are almost sitting up (semi-upright). Use pillows to do this. This helps reduce coughing.  · Rest as needed.  · Stop smoking if you smoke.  GET HELP RIGHT AWAY IF:  · You have yellowish-white fluid (pus) in your thick spit.  · Your cough gets worse.  · Your medicine does not reduce coughing, and you are losing sleep.  · You cough up blood.  · You have trouble breathing.  · Your pain gets worse and medicine does not help.  · You have a fever.  MAKE SURE YOU:   · Understand these instructions.  · Will watch your condition.  · Will get help right away if you are not doing well or get worse.  Document Released: 01/06/2011 Document Revised: 09/09/2013 Document Reviewed: 01/06/2011  ExitCare® Patient Information ©2015 [employer]. This information is not intended to replace advice given to you by your health care provider. Make sure you discuss any questions you have with your health care provider.

## 2015-11-17 ENCOUNTER — Telehealth (HOSPITAL_BASED_OUTPATIENT_CLINIC_OR_DEPARTMENT_OTHER): Payer: Self-pay | Admitting: Emergency Medicine

## 2016-05-05 DIAGNOSIS — R300 Dysuria: Secondary | ICD-10-CM | POA: Diagnosis not present

## 2016-05-05 DIAGNOSIS — K219 Gastro-esophageal reflux disease without esophagitis: Secondary | ICD-10-CM | POA: Diagnosis not present

## 2016-05-05 DIAGNOSIS — N3000 Acute cystitis without hematuria: Secondary | ICD-10-CM | POA: Diagnosis not present

## 2016-05-05 DIAGNOSIS — R413 Other amnesia: Secondary | ICD-10-CM | POA: Diagnosis not present

## 2017-05-04 ENCOUNTER — Encounter: Payer: Self-pay | Admitting: Gastroenterology

## 2017-05-04 ENCOUNTER — Encounter (INDEPENDENT_AMBULATORY_CARE_PROVIDER_SITE_OTHER): Payer: Self-pay

## 2017-05-04 ENCOUNTER — Ambulatory Visit: Payer: Medicare Other | Admitting: Gastroenterology

## 2017-05-04 VITALS — BP 114/80 | HR 88 | Ht 60.25 in | Wt 167.0 lb

## 2017-05-04 DIAGNOSIS — K5900 Constipation, unspecified: Secondary | ICD-10-CM | POA: Insufficient documentation

## 2017-05-04 NOTE — Progress Notes (Signed)
05/04/2017 Mikayla Golden [id number] 26-Jul-1950   HISTORY OF PRESENT ILLNESS:  This is a pleasant 66 year old female who is known to Dr. Fuller Plan.  Her last colonoscopy was in February 2015 at which time she was found to have 3 small sessile polyps removed from the descending colon and one semi-pedunculated polyp that was 8 mm in size removed from the rectum.  These were all tubular adenomas.  It was recommended she have a repeat colonoscopy in 5 years from that time.  She presents to our office today complaining of constipation.  She says that she has been struggling with some constipation over the past year, but it has been much worse over the past 4 months.  She tells me that she retired in 2016 and has not been as active since then.  She also reports that she does not drink much water or other fluids.  She tells me that she did try MiraLAX for a short time and while she was taking it it did seem to help to some degree.  She has not been taking Miralax recently.  She tells me that over the past week she has been going in small amounts every day.  She has noticed some change in stool caliber, stating that it is a little bit narrower than normal.  She denies any rectal bleeding, abdominal pain, weight loss.   Past Medical History:  Diagnosis Date  . 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 Hyperlipidemia    Past Surgical History:  Procedure Laterality Date  . BREAST ENHANCEMENT SURGERY  1978  . FLEXIBLE SIGMOIDOSCOPY N/A 06/14/2013   Procedure: FLEXIBLE SIGMOIDOSCOPY;  Surgeon: Jerene Bears, MD;  Location: [facility];  Service: Endoscopy;  Laterality: N/A;  . KNEE SURGERY Right 2000    reports that she quit smoking about 27 years ago. she has never used smokeless tobacco. She reports that she drinks about 0.6 oz of alcohol per week. She reports that she does not use drugs. family history is not on file. No Known Allergies    Outpatient Encounter Medications as of 05/04/2017  Medication Sig  .  aspirin EC 81 MG tablet Take 81 mg by mouth daily.  Marland Kitchen atorvastatin (LIPITOR) 20 MG tablet Take 20 mg by mouth daily at 6 PM.   . gabapentin (NEURONTIN) 300 MG capsule Take 300 mg by mouth at bedtime.   Marland Kitchen omeprazole (PRILOSEC) 20 MG capsule Take 20 mg by mouth daily.  . traZODone (DESYREL) 50 MG tablet Take 2 tablets by mouth at bedtime.  . [DISCONTINUED] benzonatate (TESSALON) 100 MG capsule Take 200 mg by mouth 3 (three) times daily as needed for cough.  . [DISCONTINUED] benzonatate (TESSALON) 100 MG capsule Take 1 capsule (100 mg total) by mouth 3 (three) times daily as needed for cough.  . [DISCONTINUED] doxylamine, Sleep, (UNISOM) 25 MG tablet Take 25 mg by mouth at bedtime as needed for sleep.   . [DISCONTINUED] guaiFENesin (MUCINEX) 600 MG 12 hr tablet Take 1 tablet (600 mg total) by mouth 2 (two) times daily.  . [DISCONTINUED] levofloxacin (LEVAQUIN) 500 MG tablet Take 500 mg by mouth daily. For 10 days   No facility-administered encounter medications on file as of 05/04/2017.      REVIEW OF SYSTEMS  : All other systems reviewed and negative except where noted in the History of Present Illness.   PHYSICAL EXAM: BP 114/80 (BP Location: Left Arm, Patient Position: Sitting, Cuff Size: Normal)   Pulse 88  Ht 5' 0.25" (1.53 m) Comment: height measured without shoes  Wt 167 lb (75.8 kg)   BMI 32.34 kg/m  General: Well developed white female in no acute distress Head: Normocephalic and atraumatic Eyes:  Sclerae anicteric, conjunctiva pink. Ears: Normal auditory acuity Lungs: Clear throughout to auscultation; no increased WOB. Heart: Regular rate and rhythm; no M/R/G. Abdomen: Soft, non-distended.  BS present.  Non-tender. Musculoskeletal: Symmetrical with no gross deformities  Skin: No lesions on visible extremities Extremities: No edema  Neurological: Alert oriented x 4, grossly non-focal Psychological:  Alert and cooperative. Normal mood and affect  ASSESSMENT AND  PLAN: *Constipation:  Present for the past year or so but worse over the past 4 months.  Has not really tried anything consistently to help her go.  Likely due to decreased activity (since retiring), poor fluid intake, etc.  I have asked her to take 3 dose of Miralax this evening to help get her bowels moving then to begin taking one dose of Miralax daily and a dose of powder fiber supplement such as Benefiber or Citrucel daily.  She will follow-up here in about 4-6 weeks.   CC:  Shirline Frees, MD

## 2017-05-04 NOTE — Patient Instructions (Signed)
Take 3 doses of Miralax tonight.   A high fiber diet with plenty of fluids (up to 8 glasses of water daily) is suggested to relieve these symptoms.  Metamucil or Citrucel, 1 tablespoon once or twice daily can be used to keep bowels regular if needed.

## 2017-05-05 NOTE — Progress Notes (Signed)
Reviewed and agree with management plan.  Lannie Heaps T. Syncere Eble, MD FACG 

## 2017-06-05 ENCOUNTER — Ambulatory Visit: Payer: Medicare Other | Admitting: Gastroenterology

## 2017-06-14 ENCOUNTER — Encounter: Payer: Self-pay | Admitting: Gastroenterology

## 2017-06-14 ENCOUNTER — Other Ambulatory Visit (INDEPENDENT_AMBULATORY_CARE_PROVIDER_SITE_OTHER): Payer: Medicare Other

## 2017-06-14 ENCOUNTER — Ambulatory Visit: Payer: Medicare Other | Admitting: Gastroenterology

## 2017-06-14 VITALS — BP 114/80 | HR 72 | Ht 61.0 in | Wt 166.0 lb

## 2017-06-14 DIAGNOSIS — K5900 Constipation, unspecified: Secondary | ICD-10-CM

## 2017-06-14 LAB — TSH: TSH: 3.51 u[IU]/mL (ref 0.35–4.50)

## 2017-06-14 NOTE — Progress Notes (Signed)
Reviewed and agree with initial management plan.  Kinisha Soper T. Loma Dubuque, MD FACG 

## 2017-06-14 NOTE — Patient Instructions (Addendum)
If you are age 67 or older, your body mass index should be between 23-30. Your Body mass index is 31.37 kg/m. If this is out of the aforementioned range listed, please consider follow up with your Primary Care Provider.  If you are age 73 or younger, your body mass index should be between 19-25. Your Body mass index is 31.37 kg/m. If this is out of the aformentioned range listed, please consider follow up with your Primary Care Provider.   Your physician has requested that you go to the basement for the following lab work before leaving today: TSH  Increase Miralax to twice daily.  Call back in 3-4 weeks with an update. Ask for Patty  Thank you for choosing me and [facility].  Alonza Bogus, PA-C

## 2017-06-14 NOTE — Progress Notes (Addendum)
06/14/2017 YEVETTE KNUST [id number] [birth year]   HISTORY OF PRESENT ILLNESS:  This is a pleasant 67 year old female who is known to Dr. Fuller Plan.  Is here today for follow-up of constipation.  Was seen by myself on 05/04/2017 at which time we started her on miralax and citrucel daily.  She says that her constipation has definitely improved but continues to sometime feel like she has incomplete evaluation.  Has a BM every day and has less straining.  Says that the caliber of her stools have also improved some as well.  Continues to deny abdominal pain and rectal bleeding.  Weight is stable.   Past Medical History:  Diagnosis Date  . 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 Hyperlipidemia    Past Surgical History:  Procedure Laterality Date  . BREAST ENHANCEMENT SURGERY  1978  . FLEXIBLE SIGMOIDOSCOPY N/A 06/14/2013   Procedure: FLEXIBLE SIGMOIDOSCOPY;  Surgeon: Jerene Bears, MD;  Location: [facility];  Service: Endoscopy;  Laterality: N/A;  . KNEE SURGERY Right 2000    reports that she quit smoking about 27 years ago. she has never used smokeless tobacco. She reports that she drinks about 0.6 oz of alcohol per week. She reports that she does not use drugs. family history is not on file. No Known Allergies    Outpatient Encounter Medications as of 06/14/2017  Medication Sig  . aspirin EC 81 MG tablet Take 81 mg by mouth daily.  Marland Kitchen atorvastatin (LIPITOR) 20 MG tablet Take 20 mg by mouth daily at 6 PM.   . gabapentin (NEURONTIN) 300 MG capsule Take 300 mg by mouth at bedtime.   Marland Kitchen omeprazole (PRILOSEC) 20 MG capsule Take 20 mg by mouth daily.  . traZODone (DESYREL) 50 MG tablet Take 2 tablets by mouth at bedtime.   No facility-administered encounter medications on file as of 06/14/2017.      REVIEW OF SYSTEMS  : All other systems reviewed and negative except where noted in the History of Present Illness.   PHYSICAL EXAM: BP 114/80   Pulse 72   Ht _0  (1.549 m)   Wt 166 lb (75.3 kg)   BMI  31.37 kg/m  General: Well developed white female in no acute distress Head: Normocephalic and atraumatic Eyes:  Sclerae anicteric, conjunctiva pink. Ears: Normal auditory acuity Lungs: Clear throughout to auscultation; no increased WOB. Heart: Regular rate and rhythm; no M/R/G. Abdomen: Soft, non-distended.  BS present.  Non-tender. Musculoskeletal: Symmetrical with no gross deformities  Skin: No lesions on visible extremities Extremities: No edema  Neurological: Alert oriented x 4, grossly non-focal Psychological:  Alert and cooperative. Normal mood and affect  ASSESSMENT AND PLAN: *Constipation:  Present for the past year or so but worse over the past several months.  Much improved on Miralax and citrucel daily.  Will continue but will also increase Miralax to BID to see if this helps any further.  Will check a TSH and request other labs results from her PCP.  She will call back in 3-4 weeks with an update on her symptoms. *History of adenomatous colon polyps:  Her last colonoscopy was in February 2015 at which time she was found to have 3 small sessile polyps removed from the descending colon and one semi-pedunculated polyp that was 8 mm in size removed from the rectum.  These were all tubular adenomas.  It was recommended she have a repeat colonoscopy in 5 years from that time, 06/2018.     **We  actually received lab results from her PCP from 04/14/2017.  TSH was actually checked at that time and it was normal at 2.78.  CMP was normal except for a mildly elevated alk phos at 136.  CBC was normal.     CC:  Shirline Frees, MD

## 2018-05-08 ENCOUNTER — Other Ambulatory Visit: Payer: Self-pay | Admitting: Family Medicine

## 2018-05-08 ENCOUNTER — Ambulatory Visit
Admission: RE | Admit: 2018-05-08 | Discharge: 2018-05-08 | Disposition: A | Discharge status: Home / Self Care | Payer: Medicare Other | Source: Ambulatory Visit | Attending: Family Medicine | Admitting: Family Medicine

## 2018-05-08 DIAGNOSIS — G8929 Other chronic pain: Secondary | ICD-10-CM

## 2018-05-08 DIAGNOSIS — K5909 Other constipation: Secondary | ICD-10-CM

## 2018-05-08 DIAGNOSIS — M545 Low back pain, unspecified: Secondary | ICD-10-CM

## 2018-07-07 ENCOUNTER — Encounter: Payer: Self-pay | Admitting: Gastroenterology

## 2019-02-25 ENCOUNTER — Encounter: Payer: Self-pay | Admitting: Gastroenterology

## 2019-03-13 ENCOUNTER — Other Ambulatory Visit: Payer: Self-pay

## 2019-03-13 ENCOUNTER — Encounter: Payer: Self-pay | Admitting: Gastroenterology

## 2019-03-13 ENCOUNTER — Ambulatory Visit (AMBULATORY_SURGERY_CENTER): Payer: Medicare Other | Admitting: *Deleted

## 2019-03-13 VITALS — Temp 96.0°F | Ht 61.0 in | Wt 158.4 lb

## 2019-03-13 DIAGNOSIS — Z86010 Personal history of colonic polyps: Secondary | ICD-10-CM

## 2019-03-13 DIAGNOSIS — Z1159 Encounter for screening for other viral diseases: Secondary | ICD-10-CM

## 2019-03-13 MED ORDER — NA SULFATE-K SULFATE-MG SULF 17.5-3.13-1.6 GM/177ML PO SOLN: 1.0000 | Freq: Once | ORAL | 0 refills | Status: AC

## 2019-03-13 NOTE — Progress Notes (Signed)
No egg or soy allergy known to patient  No issues with past sedation with any surgeries  or procedures, no intubation problems  No diet pills per patient No home 02 use per patient  No blood thinners per patient  Pt denies issues with constipation  No A fib or A flutter  EMMI video sent to pt's e mail   Due to the COVID-19 pandemic we are asking patients to follow these guidelines. Please only bring one care partner. Please be aware that your care partner may wait in the car in the parking lot or if they feel like they will be too hot to wait in the car, they may wait in the lobby on the 4th floor. All care partners are required to wear a mask the entire time (we do not have any that we can provide them), they need to practice social distancing, and we will do a Covid check for all patient's and care partners when you arrive. Also we will check their temperature and your temperature. If the care partner waits in their car they need to stay in the parking lot the entire time and we will call them on their cell phone when the patient is ready for discharge so they can bring the car to the front of the building. Also all patient's will need to wear a mask into building.  COVID SCREENING 03/22/19 8:55 AM

## 2019-03-22 ENCOUNTER — Ambulatory Visit (INDEPENDENT_AMBULATORY_CARE_PROVIDER_SITE_OTHER): Payer: Medicare Other

## 2019-03-22 ENCOUNTER — Other Ambulatory Visit: Payer: Self-pay | Admitting: Gastroenterology

## 2019-03-22 DIAGNOSIS — Z1159 Encounter for screening for other viral diseases: Secondary | ICD-10-CM

## 2019-03-25 LAB — SARS CORONAVIRUS 2 (TAT 6-24 HRS): SARS Coronavirus 2: NEGATIVE

## 2019-03-27 ENCOUNTER — Encounter: Payer: Self-pay | Admitting: Gastroenterology

## 2019-03-27 ENCOUNTER — Ambulatory Visit (AMBULATORY_SURGERY_CENTER): Payer: Medicare Other | Admitting: Gastroenterology

## 2019-03-27 ENCOUNTER — Other Ambulatory Visit: Payer: Self-pay

## 2019-03-27 VITALS — BP 140/92 | HR 87 | Temp 97.4°F | Resp 13 | Ht 61.0 in | Wt 158.0 lb

## 2019-03-27 DIAGNOSIS — D124 Benign neoplasm of descending colon: Secondary | ICD-10-CM

## 2019-03-27 DIAGNOSIS — D123 Benign neoplasm of transverse colon: Secondary | ICD-10-CM | POA: Diagnosis not present

## 2019-03-27 DIAGNOSIS — D125 Benign neoplasm of sigmoid colon: Secondary | ICD-10-CM

## 2019-03-27 DIAGNOSIS — Z86010 Personal history of colonic polyps: Secondary | ICD-10-CM | POA: Diagnosis not present

## 2019-03-27 DIAGNOSIS — D120 Benign neoplasm of cecum: Secondary | ICD-10-CM | POA: Diagnosis not present

## 2019-03-27 MED ORDER — SODIUM CHLORIDE 0.9 % IV SOLN: 500.0000 mL | INTRAVENOUS | Status: DC

## 2019-03-27 NOTE — Op Note (Signed)
[facility] Patient Name: Mikayla Golden Procedure Date: 03/27/2019 10:22 AM MRN: [id number] Endoscopist: Ladene Artist , MD Age: 68 Referring MD:  Date of Birth: 15-Oct-1950 Gender: Female Account #: [id number] Procedure:                Colonoscopy Indications:              Surveillance: Personal history of adenomatous                            polyps on last colonoscopy 5 years ago Medicines:                Monitored Anesthesia Care Procedure:                Pre-Anesthesia Assessment:                           - Prior to the procedure, a History and Physical                            was performed, and patient medications and                            allergies were reviewed. The patient's tolerance of                            previous anesthesia was also reviewed. The risks                            and benefits of the procedure and the sedation                            options and risks were discussed with the patient.                            All questions were answered, and informed consent                            was obtained. Prior Anticoagulants: The patient has                            taken no previous anticoagulant or antiplatelet                            agents. ASA Grade Assessment: II - A patient with                            mild systemic disease. After reviewing the risks                            and benefits, the patient was deemed in                            satisfactory condition to undergo the procedure.  After obtaining informed consent, the colonoscope                            was passed under direct vision. Throughout the                            procedure, the patient's blood pressure, pulse, and                            oxygen saturations were monitored continuously. The                            Colonoscope was introduced through the anus and                            advanced to the the  cecum, identified by                            appendiceal orifice and ileocecal valve. The                            ileocecal valve, appendiceal orifice, and rectum                            were photographed. The quality of the bowel                            preparation was good. The colonoscopy was performed                            without difficulty. The patient tolerated the                            procedure well. Scope In: 10:27:37 AM Scope Out: 10:48:34 AM Scope Withdrawal Time: 0 hours 19 minutes 12 seconds  Total Procedure Duration: 0 hours 20 minutes 57 seconds  Findings:                 The perianal and digital rectal examinations were                            normal.                           A 8 mm polyp was found in the cecum. The polyp was                            sessile. The polyp was removed with a cold snare.                            Resection and retrieval were complete. Persistent                            oozing at polypectomy site. For hemostasis, one  hemostatic clip was successfully placed (MR                            conditional). There was no bleeding at the end of                            the procedure.                           Four sessile polyps were found in the sigmoid                            colon, descending colon (2) and transverse colon.                            The polyps were 6 to 7 mm in size. These polyps                            were removed with a cold snare. Resection and                            retrieval were complete.                           A diffuse area of moderate melanosis was found in                            the entire colon.                           The exam was otherwise without abnormality on                            direct and retroflexion views. Complications:            No immediate complications. Estimated blood loss:                            None. Estimated  Blood Loss:     Estimated blood loss: none. Impression:               - One 8 mm polyp in the cecum, removed with a cold                            snare. Resected and retrieved. Clip (MR                            conditional) was placed.                           - Four 6 to 7 mm polyps in the sigmoid colon, in                            the descending colon and in the transverse colon,  removed with a cold snare. Resected and retrieved.                           - Melanosis in the colon.                           - The examination was otherwise normal on direct                            and retroflexion views. Recommendation:           - Repeat colonoscopy after studies are complete for                            surveillance based on pathology results.                           - Patient has a contact number available for                            emergencies. The signs and symptoms of potential                            delayed complications were discussed with the                            patient. Return to normal activities tomorrow.                            Written discharge instructions were provided to the                            patient.                           - Resume previous diet.                           - Continue present medications.                           - Await pathology results.                           - No strenuous activities for 5 days.                           - No aspirin, ibuprofen, naproxen, or other                            non-steroidal anti-inflammatory drugs for 2 weeks                            after polyp removal.                           - No herbal supplements for  2 weeks.                           - Miralax once or twice daily for constipation. Ladene Artist, MD 03/27/2019 10:56:51 AM This report has been signed electronically.

## 2019-03-27 NOTE — Progress Notes (Signed)
Temp JB  v/s CW I have reviewed the patient's medical history in detail and updated the computerized patient record. 

## 2019-03-27 NOTE — Patient Instructions (Signed)
Discharge instructions given. Handout on polyps. Clip card given. No strenuous activities for 5 days. No aspirin,ibuprofen, or other non-steroidal anti-inflammatory drugs for 2 weeks. No herbal supplements for 2 weeks. miralax once or twice daily for constipation. YOU HAD AN ENDOSCOPIC PROCEDURE TODAY AT [facility]:   Refer to the procedure report that was given to you for any specific questions about what was found during the examination.  If the procedure report does not answer your questions, please call your gastroenterologist to clarify.  If you requested that your care partner not be given the details of your procedure findings, then the procedure report has been included in a sealed envelope for you to review at your convenience later.  YOU SHOULD EXPECT: Some feelings of bloating in the abdomen. Passage of more gas than usual.  Walking can help get rid of the air that was put into your GI tract during the procedure and reduce the bloating. If you had a lower endoscopy (such as a colonoscopy or flexible sigmoidoscopy) you may notice spotting of blood in your stool or on the toilet paper. If you underwent a bowel prep for your procedure, you may not have a normal bowel movement for a few days.  Please Note:  You might notice some irritation and congestion in your nose or some drainage.  This is from the oxygen used during your procedure.  There is no need for concern and it should clear up in a day or so.  SYMPTOMS TO REPORT IMMEDIATELY:   Following lower endoscopy (colonoscopy or flexible sigmoidoscopy):  Excessive amounts of blood in the stool  Significant tenderness or worsening of abdominal pains  Swelling of the abdomen that is new, acute  Fever of 100F or higher   For urgent or emergent issues, a gastroenterologist can be reached at any hour by calling [phone].   DIET:  We do recommend a small meal at first, but then you may proceed to your regular  diet.  Drink plenty of fluids but you should avoid alcoholic beverages for 24 hours.  ACTIVITY:  You should plan to take it easy for the rest of today and you should NOT DRIVE or use heavy machinery until tomorrow (because of the sedation medicines used during the test).    FOLLOW UP: Our staff will call the number listed on your records 48-72 hours following your procedure to check on you and address any questions or concerns that you may have regarding the information given to you following your procedure. If we do not reach you, we will leave a message.  We will attempt to reach you two times.  During this call, we will ask if you have developed any symptoms of COVID 19. If you develop any symptoms (ie: fever, flu-like symptoms, shortness of breath, cough etc.) before then, please call [phone].  If you test positive for Covid 19 in the 2 weeks post procedure, please call and report this information to [place].    If any biopsies were taken you will be contacted by phone or by letter within the next 1-3 weeks.  Please call us at [phone] if you have not heard about the biopsies in 3 weeks.    SIGNATURES/CONFIDENTIALITY: You and/or your care partner have signed paperwork which will be entered into your electronic medical record.  These signatures attest to the fact that that the information above on your After Visit Summary has been reviewed and is understood.  Full responsibility of the  confidentiality of this discharge information lies with you and/or your care-partner. 

## 2019-03-27 NOTE — Progress Notes (Signed)
To PACU, VSS. Report to [email or web address] 

## 2019-03-27 NOTE — Progress Notes (Signed)
Called to room to assist during endoscopic procedure.  Patient ID and intended procedure confirmed with present staff. Received instructions for my participation in the procedure from the performing physician.  

## 2019-03-29 ENCOUNTER — Telehealth: Payer: Self-pay | Admitting: *Deleted

## 2019-03-29 ENCOUNTER — Telehealth: Payer: Self-pay

## 2019-03-29 NOTE — Telephone Encounter (Signed)
  Follow up Call-  Call back number 03/27/2019  Post procedure Call Back phone  # [phone]  Permission to leave phone message Yes  Some recent data might be hidden     No answer at # given.  LM on vm.

## 2019-03-29 NOTE — Telephone Encounter (Signed)
NO ANSWER, MESSAGE LEFT FOR PATIENT. 

## 2019-04-09 ENCOUNTER — Encounter: Payer: Self-pay | Admitting: Gastroenterology

## 2020-04-09 IMAGING — CR DG LUMBAR SPINE 2-3V
3 series · 3 of 3 positions shown · non-contrast
Comparison: None.

CLINICAL DATA: One year of low back pain and constipation.

EXAM:
LUMBAR SPINE - 2-3 VIEW

[t lumbar spine ap]
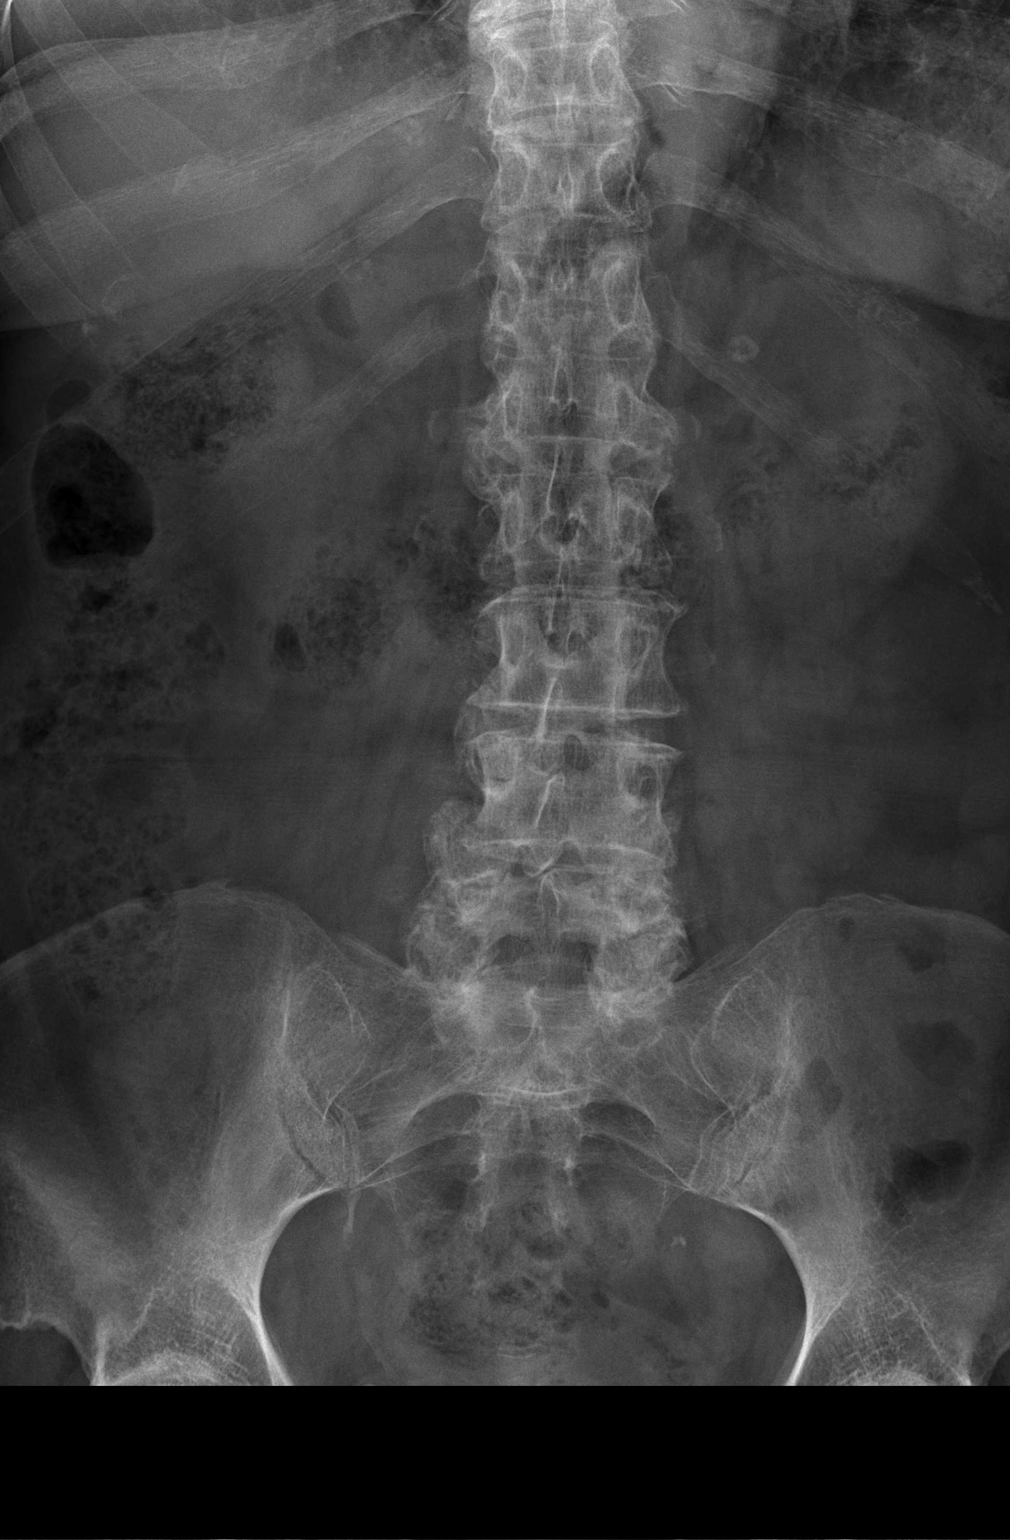

[t lumbar spine lat]
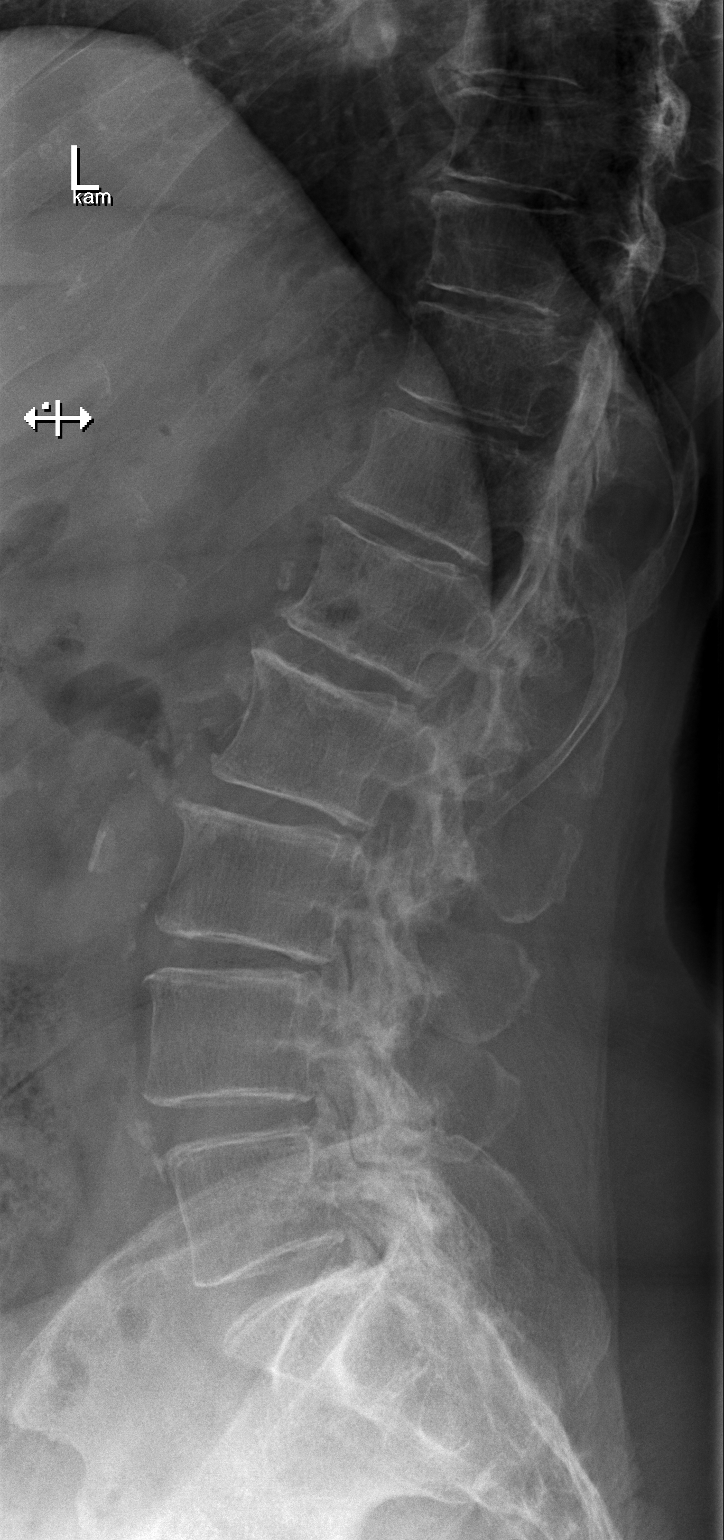

[t lumbar l-5 s-1 spot]
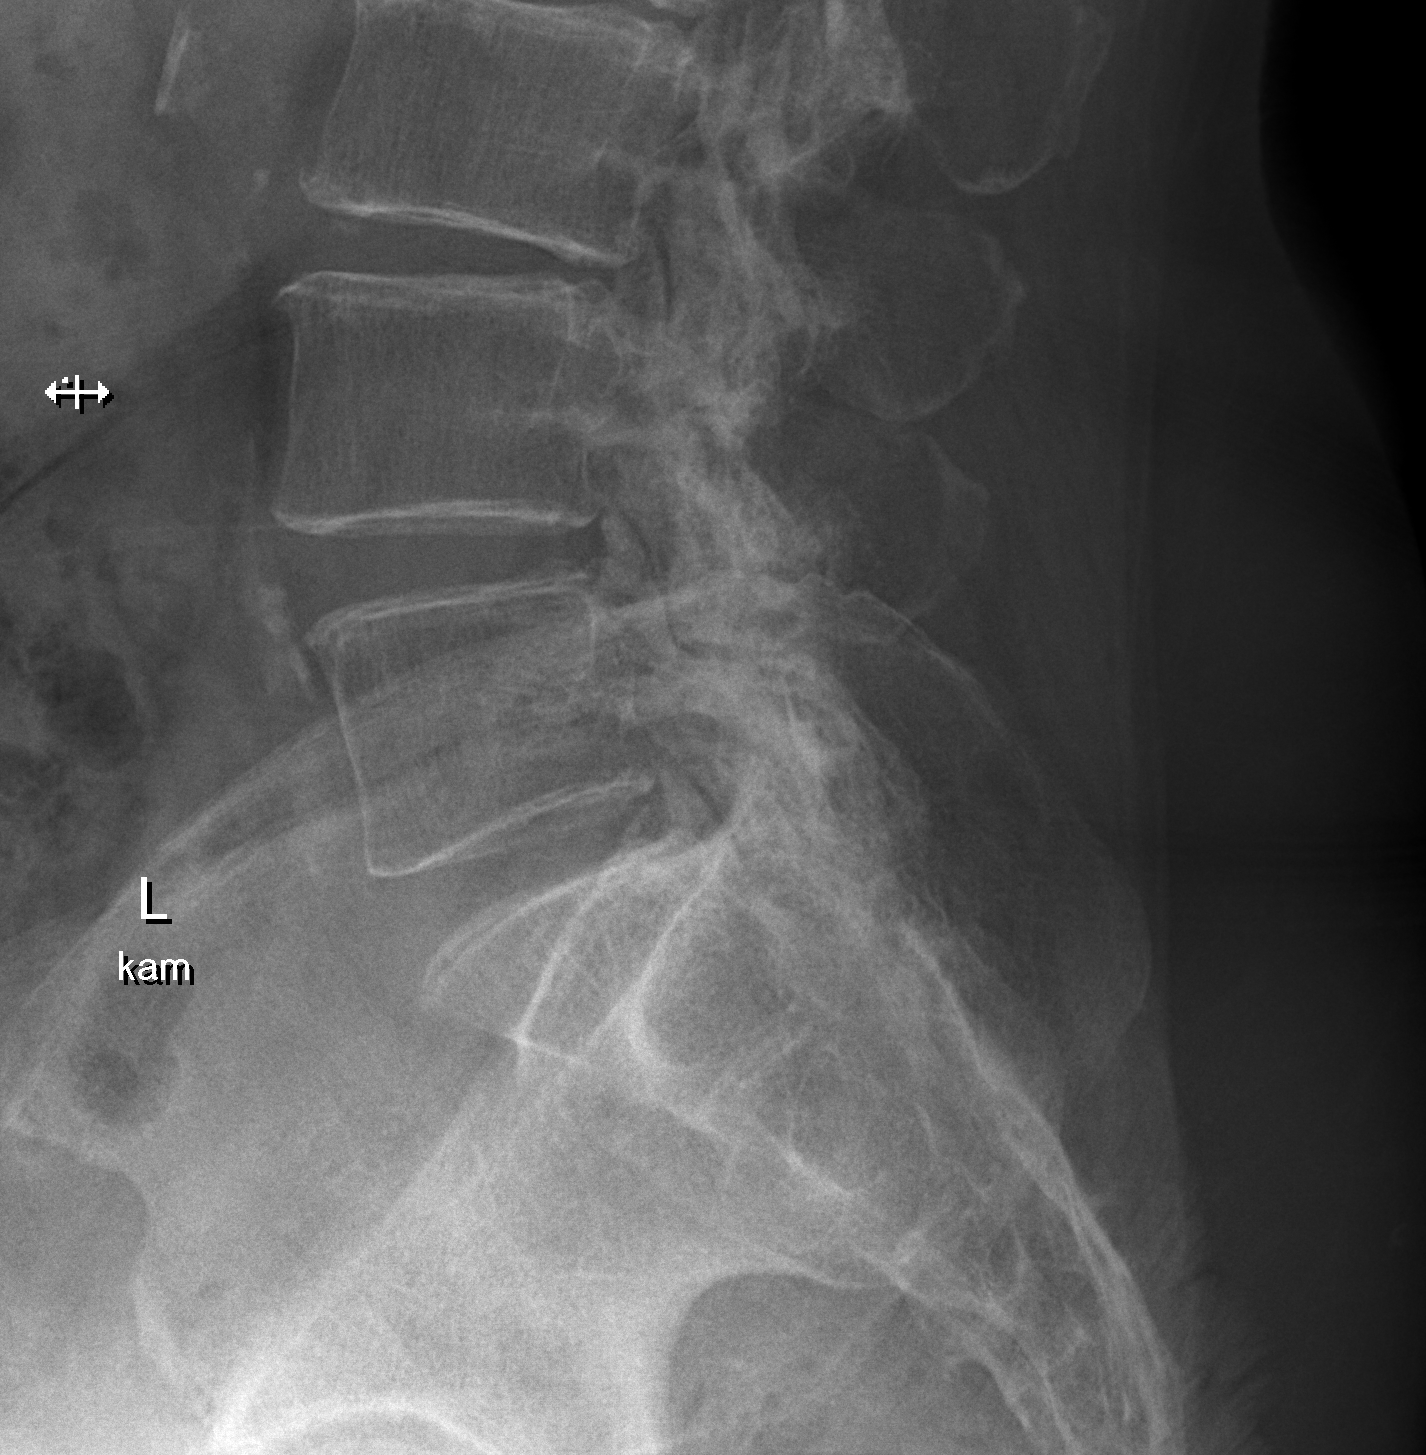

[3 of 3 positions shown; findings below may reference images not displayed]

FINDINGS: The lumbar vertebral bodies are preserved in height. There is
moderate disc space narrowing at L2-3 and L3-4 with milder narrowing
at L1-2 and L4-5. There is facet joint hypertrophy at L4-5 and
L5-S1. There is no spondylolisthesis. The pedicles and transverse
processes are intact. The observed portions of the sacrum are
normal. A rounded calcification projects over the upper pole of the
left kidney which could reflect a kidney stone or be outside the
kidney.
IMPRESSION: Multilevel mild to moderate degenerative disc and facet joint
change. No compression fracture or spondylolisthesis.

## 2020-06-29 DIAGNOSIS — M545 Low back pain, unspecified: Secondary | ICD-10-CM | POA: Diagnosis not present

## 2020-06-29 DIAGNOSIS — E7800 Pure hypercholesterolemia, unspecified: Secondary | ICD-10-CM | POA: Diagnosis not present

## 2020-06-29 DIAGNOSIS — Z23 Encounter for immunization: Secondary | ICD-10-CM | POA: Diagnosis not present

## 2020-06-29 DIAGNOSIS — G8929 Other chronic pain: Secondary | ICD-10-CM | POA: Diagnosis not present

## 2020-06-29 DIAGNOSIS — E039 Hypothyroidism, unspecified: Secondary | ICD-10-CM | POA: Diagnosis not present

## 2020-06-29 DIAGNOSIS — F5101 Primary insomnia: Secondary | ICD-10-CM | POA: Diagnosis not present

## 2020-06-29 DIAGNOSIS — K219 Gastro-esophageal reflux disease without esophagitis: Secondary | ICD-10-CM | POA: Diagnosis not present

## 2020-06-29 DIAGNOSIS — K5909 Other constipation: Secondary | ICD-10-CM | POA: Diagnosis not present

## 2020-06-29 DIAGNOSIS — G2581 Restless legs syndrome: Secondary | ICD-10-CM | POA: Diagnosis not present

## 2020-08-12 DIAGNOSIS — M19041 Primary osteoarthritis, right hand: Secondary | ICD-10-CM | POA: Diagnosis not present

## 2020-08-12 DIAGNOSIS — E039 Hypothyroidism, unspecified: Secondary | ICD-10-CM | POA: Diagnosis not present

## 2020-08-12 DIAGNOSIS — G8929 Other chronic pain: Secondary | ICD-10-CM | POA: Diagnosis not present

## 2020-08-12 DIAGNOSIS — K219 Gastro-esophageal reflux disease without esophagitis: Secondary | ICD-10-CM | POA: Diagnosis not present

## 2020-08-12 DIAGNOSIS — E7800 Pure hypercholesterolemia, unspecified: Secondary | ICD-10-CM | POA: Diagnosis not present

## 2020-12-24 DIAGNOSIS — K5909 Other constipation: Secondary | ICD-10-CM | POA: Diagnosis not present

## 2020-12-24 DIAGNOSIS — Z0000 Encounter for general adult medical examination without abnormal findings: Secondary | ICD-10-CM | POA: Diagnosis not present

## 2020-12-24 DIAGNOSIS — E039 Hypothyroidism, unspecified: Secondary | ICD-10-CM | POA: Diagnosis not present

## 2020-12-24 DIAGNOSIS — F5101 Primary insomnia: Secondary | ICD-10-CM | POA: Diagnosis not present

## 2020-12-24 DIAGNOSIS — M545 Low back pain, unspecified: Secondary | ICD-10-CM | POA: Diagnosis not present

## 2020-12-24 DIAGNOSIS — G2581 Restless legs syndrome: Secondary | ICD-10-CM | POA: Diagnosis not present

## 2020-12-24 DIAGNOSIS — K219 Gastro-esophageal reflux disease without esophagitis: Secondary | ICD-10-CM | POA: Diagnosis not present

## 2020-12-24 DIAGNOSIS — E7800 Pure hypercholesterolemia, unspecified: Secondary | ICD-10-CM | POA: Diagnosis not present

## 2020-12-24 DIAGNOSIS — G8929 Other chronic pain: Secondary | ICD-10-CM | POA: Diagnosis not present

## 2021-01-04 DIAGNOSIS — Z1231 Encounter for screening mammogram for malignant neoplasm of breast: Secondary | ICD-10-CM | POA: Diagnosis not present

## 2021-03-03 DIAGNOSIS — M19041 Primary osteoarthritis, right hand: Secondary | ICD-10-CM | POA: Diagnosis not present

## 2021-03-03 DIAGNOSIS — E039 Hypothyroidism, unspecified: Secondary | ICD-10-CM | POA: Diagnosis not present

## 2021-03-03 DIAGNOSIS — K219 Gastro-esophageal reflux disease without esophagitis: Secondary | ICD-10-CM | POA: Diagnosis not present

## 2021-03-03 DIAGNOSIS — E7800 Pure hypercholesterolemia, unspecified: Secondary | ICD-10-CM | POA: Diagnosis not present

## 2021-03-03 DIAGNOSIS — G8929 Other chronic pain: Secondary | ICD-10-CM | POA: Diagnosis not present

## 2021-07-13 DIAGNOSIS — K5909 Other constipation: Secondary | ICD-10-CM | POA: Diagnosis not present

## 2021-07-13 DIAGNOSIS — F5101 Primary insomnia: Secondary | ICD-10-CM | POA: Diagnosis not present

## 2021-07-13 DIAGNOSIS — E039 Hypothyroidism, unspecified: Secondary | ICD-10-CM | POA: Diagnosis not present

## 2021-07-13 DIAGNOSIS — G2581 Restless legs syndrome: Secondary | ICD-10-CM | POA: Diagnosis not present

## 2021-07-13 DIAGNOSIS — J0100 Acute maxillary sinusitis, unspecified: Secondary | ICD-10-CM | POA: Diagnosis not present

## 2021-07-13 DIAGNOSIS — G8929 Other chronic pain: Secondary | ICD-10-CM | POA: Diagnosis not present

## 2021-07-13 DIAGNOSIS — E7800 Pure hypercholesterolemia, unspecified: Secondary | ICD-10-CM | POA: Diagnosis not present

## 2021-07-13 DIAGNOSIS — K219 Gastro-esophageal reflux disease without esophagitis: Secondary | ICD-10-CM | POA: Diagnosis not present

## 2021-09-28 DIAGNOSIS — H2511 Age-related nuclear cataract, right eye: Secondary | ICD-10-CM | POA: Diagnosis not present

## 2021-09-28 DIAGNOSIS — H2512 Age-related nuclear cataract, left eye: Secondary | ICD-10-CM | POA: Diagnosis not present

## 2021-09-28 DIAGNOSIS — Z01818 Encounter for other preprocedural examination: Secondary | ICD-10-CM | POA: Diagnosis not present

## 2021-10-07 DIAGNOSIS — H2511 Age-related nuclear cataract, right eye: Secondary | ICD-10-CM | POA: Diagnosis not present

## 2021-10-07 DIAGNOSIS — H269 Unspecified cataract: Secondary | ICD-10-CM | POA: Diagnosis not present

## 2021-10-28 DIAGNOSIS — H269 Unspecified cataract: Secondary | ICD-10-CM | POA: Diagnosis not present

## 2021-10-28 DIAGNOSIS — H2512 Age-related nuclear cataract, left eye: Secondary | ICD-10-CM | POA: Diagnosis not present

## 2022-01-06 DIAGNOSIS — Z23 Encounter for immunization: Secondary | ICD-10-CM | POA: Diagnosis not present

## 2022-01-06 DIAGNOSIS — E7800 Pure hypercholesterolemia, unspecified: Secondary | ICD-10-CM | POA: Diagnosis not present

## 2022-01-06 DIAGNOSIS — G8929 Other chronic pain: Secondary | ICD-10-CM | POA: Diagnosis not present

## 2022-01-06 DIAGNOSIS — K219 Gastro-esophageal reflux disease without esophagitis: Secondary | ICD-10-CM | POA: Diagnosis not present

## 2022-01-06 DIAGNOSIS — F5101 Primary insomnia: Secondary | ICD-10-CM | POA: Diagnosis not present

## 2022-01-06 DIAGNOSIS — Z0000 Encounter for general adult medical examination without abnormal findings: Secondary | ICD-10-CM | POA: Diagnosis not present

## 2022-01-06 DIAGNOSIS — G2581 Restless legs syndrome: Secondary | ICD-10-CM | POA: Diagnosis not present

## 2022-01-06 DIAGNOSIS — E039 Hypothyroidism, unspecified: Secondary | ICD-10-CM | POA: Diagnosis not present

## 2022-03-30 ENCOUNTER — Encounter: Payer: Self-pay | Admitting: Gastroenterology

## 2022-05-13 DIAGNOSIS — M549 Dorsalgia, unspecified: Secondary | ICD-10-CM | POA: Diagnosis not present

## 2022-05-23 ENCOUNTER — Other Ambulatory Visit: Payer: Self-pay

## 2022-05-23 ENCOUNTER — Encounter (HOSPITAL_COMMUNITY): Payer: Self-pay

## 2022-05-23 ENCOUNTER — Emergency Department (HOSPITAL_COMMUNITY)
Admission: EM | Admit: 2022-05-23 | Discharge: 2022-05-23 | Disposition: A | Discharge status: Home / Self Care | Payer: Medicare Other | Attending: Emergency Medicine | Admitting: Emergency Medicine

## 2022-05-23 DIAGNOSIS — Z7982 Long term (current) use of aspirin: Secondary | ICD-10-CM | POA: Diagnosis not present

## 2022-05-23 DIAGNOSIS — R339 Retention of urine, unspecified: Secondary | ICD-10-CM | POA: Diagnosis not present

## 2022-05-23 LAB — CBC WITH DIFFERENTIAL/PLATELET
Abs Immature Granulocytes: 0.01 10*3/uL (ref 0.00–0.07)
Basophils Absolute: 0.1 10*3/uL (ref 0.0–0.1)
Basophils Relative: 1 %
Eosinophils Absolute: 0.1 10*3/uL (ref 0.0–0.5)
Eosinophils Relative: 3 %
HCT: 38.9 % (ref 36.0–46.0)
Hemoglobin: 13 g/dL (ref 12.0–15.0)
Immature Granulocytes: 0 %
Lymphocytes Relative: 23 %
Lymphs Abs: 1.1 10*3/uL (ref 0.7–4.0)
MCH: 32.7 pg (ref 26.0–34.0)
MCHC: 33.4 g/dL (ref 30.0–36.0)
MCV: 97.7 fL (ref 80.0–100.0)
Monocytes Absolute: 0.4 10*3/uL (ref 0.1–1.0)
Monocytes Relative: 8 %
Neutro Abs: 3.2 10*3/uL (ref 1.7–7.7)
Neutrophils Relative %: 65 %
Platelets: 226 10*3/uL (ref 150–400)
RBC: 3.98 MIL/uL (ref 3.87–5.11)
RDW: 12.4 % (ref 11.5–15.5)
WBC: 4.9 10*3/uL (ref 4.0–10.5)
nRBC: 0 % (ref 0.0–0.2)

## 2022-05-23 LAB — COMPREHENSIVE METABOLIC PANEL
ALT: 14 U/L (ref 0–44)
AST: 20 U/L (ref 15–41)
Albumin: 3.8 g/dL (ref 3.5–5.0)
Alkaline Phosphatase: 129 U/L — ABNORMAL HIGH (ref 38–126)
Anion gap: 8 (ref 5–15)
BUN: 14 mg/dL (ref 8–23)
CO2: 23 mmol/L (ref 22–32)
Calcium: 8.4 mg/dL — ABNORMAL LOW (ref 8.9–10.3)
Chloride: 104 mmol/L (ref 98–111)
Creatinine, Ser: 0.89 mg/dL (ref 0.44–1.00)
GFR, Estimated: >60 mL/min (ref >60)
Glucose, Bld: 105 mg/dL — ABNORMAL HIGH (ref 70–99)
Potassium: 3.9 mmol/L (ref 3.5–5.1)
Sodium: 135 mmol/L (ref 135–145)
Total Bilirubin: 0.5 mg/dL (ref 0.3–1.2)
Total Protein: 7 g/dL (ref 6.5–8.1)

## 2022-05-23 NOTE — ED Triage Notes (Signed)
Patient said she was given a muscle relaxer for her back pain that caused her to have not been able to urinate since 7am this morning. Said she is also constipated.

## 2022-05-23 NOTE — ED Provider Triage Note (Signed)
Emergency Medicine Provider Triage Evaluation Note  Mikayla Golden , a 72 y.o. female  was evaluated in triage.  Pt complains of urinary retention.  She states she had a small void this morning otherwise the last decent void she recalls was Saturday evening.  She has been taken Robaxin as well as tramadol.  She states the last dose of Robaxin was 1/10.  She was prescribed tramadol then which she has been taking consistently every 6 hours.  She denies an urge to urinate.  Denies abdominal pain.  Review of Systems  Positive: As above Negative: As above  Physical Exam  BP (!) 137/94   Pulse 85   Temp 97.6 F (36.4 C) (Oral)   Resp 19   SpO2 97%  Gen:   Awake, no distress   Resp:  Normal effort  MSK:   Moves extremities without difficulty  Other:    Medical Decision Making  Medically screening exam initiated at 6:01 PM.  Appropriate orders placed.  Cloria Spring was informed that the remainder of the evaluation will be completed by another provider, this initial triage assessment does not replace that evaluation, and the importance of remaining in the ED until their evaluation is complete.     Evlyn Courier, PA-C 05/23/22 5397

## 2022-05-23 NOTE — ED Provider Notes (Signed)
Mikayla Golden   CSN: [id number] Arrival date & time: 05/23/22  1744     History  Chief Complaint  Patient presents with   Urinary Retention    Mikayla Golden is a 72 y.o. female.  Pt complains of urinary retention.  She states she had a small void this morning otherwise the last decent void she recalls was Saturday evening.  She has been taken Robaxin as well as tramadol.  She states the last dose of Robaxin was 1/10.  She was prescribed tramadol then which she has been taking consistently every 6 hours.  She denies an urge to urinate.  Denies abdominal pain.  The history is provided by the patient. No language interpreter was used.       Home Medications Prior to Admission medications   Medication Sig Start Date End Date Taking? Authorizing Provider  Ascorbic Acid (VITAMIN C) 1000 MG tablet Take 1,000 mg by mouth 2 (two) times daily.    [provider]  aspirin EC 81 MG tablet Take 81 mg by mouth daily.    [provider]  atorvastatin (LIPITOR) 20 MG tablet Take 20 mg by mouth daily at 6 PM.     [provider]  CASCARA SAGRADA PO Take by mouth.    [provider]  etodolac (LODINE) 400 MG tablet Take 400 mg by mouth 2 (two) times daily. 02/10/19   [provider]  gabapentin (NEURONTIN) 300 MG capsule Take 300 mg by mouth at bedtime.     [provider]  levothyroxine (SYNTHROID) 25 MCG tablet Take 25 mcg by mouth every morning. 01/17/19   [provider]  omeprazole (PRILOSEC) 20 MG capsule Take 20 mg by mouth daily.    [provider]  Psyllium (METAMUCIL FIBER PO) Take by mouth 2 (two) times daily.    [provider]  traZODone (DESYREL) 50 MG tablet Take 2 tablets by mouth at bedtime. 04/09/17   [provider]      Allergies    Flexeril [cyclobenzaprine]    Review of Systems   Review of Systems  Constitutional:  Negative for fever.   Gastrointestinal:  Negative for abdominal distention and abdominal pain.  Genitourinary:  Positive for difficulty urinating. Negative for dysuria, flank pain and frequency.  All other systems reviewed and are negative.   Physical Exam Updated Vital Signs BP (!) 137/94   Pulse 85   Temp 97.6 F (36.4 C) (Oral)   Resp 19   SpO2 97%  Physical Exam Vitals and nursing Golden reviewed.  Constitutional:      General: She is not in acute distress.    Appearance: Normal appearance. She is not ill-appearing.  HENT:     Head: Normocephalic and atraumatic.     Nose: Nose normal.  Eyes:     General: No scleral icterus.    Extraocular Movements: Extraocular movements intact.     Conjunctiva/sclera: Conjunctivae normal.  Cardiovascular:     Rate and Rhythm: Normal rate and regular rhythm.     Pulses: Normal pulses.  Pulmonary:     Effort: Pulmonary effort is normal. No respiratory distress.     Breath sounds: Normal breath sounds. No wheezing or rales.  Abdominal:     General: There is no distension.     Palpations: Abdomen is soft.     Tenderness: There is no abdominal tenderness. There is no right CVA tenderness, left CVA tenderness, guarding or rebound.  Musculoskeletal:  General: Normal range of motion.     Cervical back: Normal range of motion.  Skin:    General: Skin is warm and dry.  Neurological:     General: No focal deficit present.     Mental Status: She is alert. Mental status is at baseline.     ED Results / Procedures / Treatments   Labs (all labs ordered are listed, but only abnormal results are displayed) Labs Reviewed  COMPREHENSIVE METABOLIC PANEL - Abnormal; Notable for the following components:      Result Value   Glucose, Bld 105 (*)    Calcium 8.4 (*)    Alkaline Phosphatase 129 (*)    All other components within normal limits  CBC WITH DIFFERENTIAL/PLATELET  URINALYSIS, ROUTINE W REFLEX MICROSCOPIC    EKG None  Radiology No results  found.  Procedures Procedures    Medications Ordered in ED Medications - No data to display  ED Course/ Medical Decision Making/ A&P                             Medical Decision Making Amount and/or Complexity of Data Reviewed Labs: ordered.   72 year old female presents today for evaluation of concern for urinary retention.  Abdomen is soft, and nondistended.  CBC is unremarkable.  CMP with preserved renal function, shows mildly elevated alk phos however normal T. bili and normal LFTs.  Abdomen without tenderness.  Initial bladder scan showed volume of 26 mL.  While patient was waiting in the emergency department she states she has had a large void.  She did not provide a urine sample.  Patient states she does not want to wait any longer and would like to be discharged and she will follow-up with her PCP.  Given she has been able to void, and no significant volume of the bladder scan I feel this is reasonable.  Patient is appropriate for discharge.  Discharged in stable condition.  Discussed close follow-up with her PCP.  Return precautions to the emergency department discussed with patient.  Patient and plan discussed with attending who is in agreement.  He saw patient at bedside.  Final Clinical Impression(s) / ED Diagnoses Final diagnoses:  Urinary retention    Rx / DC Orders ED Discharge Orders     None         Evlyn Courier, Hershal Coria 05/23/22 2125    Lacretia Leigh, MD 05/23/22 2154

## 2022-05-23 NOTE — Discharge Instructions (Addendum)
Your work today was reassuring.  Blood work was without any concerns.  Your bladder scan did not show any significant amount of urine within the bladder.  While you are waiting in the emergency department you were able to void without difficulty.  I recommend you call and follow-up with your primary care provider.  For any concerning symptoms return to the emergency department.

## 2022-05-26 ENCOUNTER — Ambulatory Visit
Admission: RE | Admit: 2022-05-26 | Discharge: 2022-05-26 | Disposition: A | Discharge status: Home / Self Care | Payer: Medicare Other | Source: Ambulatory Visit | Attending: Family Medicine | Admitting: Family Medicine

## 2022-05-26 ENCOUNTER — Other Ambulatory Visit: Payer: Self-pay | Admitting: Family Medicine

## 2022-05-26 DIAGNOSIS — R822 Biliuria: Secondary | ICD-10-CM | POA: Diagnosis not present

## 2022-05-26 DIAGNOSIS — M8588 Other specified disorders of bone density and structure, other site: Secondary | ICD-10-CM | POA: Diagnosis not present

## 2022-05-26 DIAGNOSIS — N3000 Acute cystitis without hematuria: Secondary | ICD-10-CM | POA: Diagnosis not present

## 2022-05-26 DIAGNOSIS — M545 Low back pain, unspecified: Secondary | ICD-10-CM

## 2022-05-26 DIAGNOSIS — M4316 Spondylolisthesis, lumbar region: Secondary | ICD-10-CM | POA: Diagnosis not present

## 2022-05-26 DIAGNOSIS — G8929 Other chronic pain: Secondary | ICD-10-CM | POA: Diagnosis not present

## 2022-05-26 DIAGNOSIS — K5909 Other constipation: Secondary | ICD-10-CM | POA: Diagnosis not present

## 2022-05-26 DIAGNOSIS — M47816 Spondylosis without myelopathy or radiculopathy, lumbar region: Secondary | ICD-10-CM | POA: Diagnosis not present

## 2022-05-30 DIAGNOSIS — R748 Abnormal levels of other serum enzymes: Secondary | ICD-10-CM | POA: Diagnosis not present

## 2022-06-07 DIAGNOSIS — M545 Low back pain, unspecified: Secondary | ICD-10-CM | POA: Diagnosis not present

## 2022-06-14 DIAGNOSIS — M545 Low back pain, unspecified: Secondary | ICD-10-CM | POA: Diagnosis not present

## 2022-06-17 DIAGNOSIS — M545 Low back pain, unspecified: Secondary | ICD-10-CM | POA: Diagnosis not present

## 2022-06-24 ENCOUNTER — Encounter: Payer: Self-pay | Admitting: Gastroenterology

## 2022-07-11 DIAGNOSIS — H905 Unspecified sensorineural hearing loss: Secondary | ICD-10-CM | POA: Diagnosis not present

## 2022-07-26 ENCOUNTER — Ambulatory Visit (AMBULATORY_SURGERY_CENTER): Payer: Medicare Other | Admitting: *Deleted

## 2022-07-26 ENCOUNTER — Encounter: Payer: Self-pay | Admitting: Gastroenterology

## 2022-07-26 VITALS — Ht 61.0 in | Wt 148.0 lb

## 2022-07-26 DIAGNOSIS — Z86010 Personal history of colonic polyps: Secondary | ICD-10-CM

## 2022-07-26 MED ORDER — NA SULFATE-K SULFATE-MG SULF 17.5-3.13-1.6 GM/177ML PO SOLN: 1.0000 | Freq: Once | ORAL | 0 refills | Status: AC

## 2022-07-26 NOTE — Progress Notes (Signed)
Pt's previsit is done over the phone and all paperwork (prep instructions) sent to patient.  Pt's name and DOB verified at the beginning of the previsit.  Pt denies any difficulty with ambulating.   No egg or soy allergy known to patient  No issues known to pt with past sedation with any surgeries or procedures Patient denies ever being told they had issues or difficulty with intubation  No FH of Malignant Hyperthermia Pt is not on diet pills Pt is not on  home 02  Pt is not on blood thinners  Pt is not on dialysis Pt denies any upcoming cardiac testing Pt encouraged to use to use Singlecare or Goodrx to reduce cost  2 day Suprep/Miralax prep given  Patient's chart reviewed by Osvaldo Angst CNRA prior to previsit and patient appropriate for the [facility].  Previsit completed and red dot placed by patient's name on their procedure day (on provider's schedule).

## 2022-08-09 ENCOUNTER — Encounter: Payer: Medicare Other | Admitting: Gastroenterology

## 2022-11-02 DIAGNOSIS — Z1231 Encounter for screening mammogram for malignant neoplasm of breast: Secondary | ICD-10-CM | POA: Diagnosis not present

## 2022-11-02 DIAGNOSIS — E349 Endocrine disorder, unspecified: Secondary | ICD-10-CM | POA: Diagnosis not present

## 2022-11-02 DIAGNOSIS — M8588 Other specified disorders of bone density and structure, other site: Secondary | ICD-10-CM | POA: Diagnosis not present

## 2023-01-24 DIAGNOSIS — K219 Gastro-esophageal reflux disease without esophagitis: Secondary | ICD-10-CM | POA: Diagnosis not present

## 2023-01-24 DIAGNOSIS — E7800 Pure hypercholesterolemia, unspecified: Secondary | ICD-10-CM | POA: Diagnosis not present

## 2023-01-24 DIAGNOSIS — M549 Dorsalgia, unspecified: Secondary | ICD-10-CM | POA: Diagnosis not present

## 2023-01-24 DIAGNOSIS — G8929 Other chronic pain: Secondary | ICD-10-CM | POA: Diagnosis not present

## 2023-01-24 DIAGNOSIS — Z23 Encounter for immunization: Secondary | ICD-10-CM | POA: Diagnosis not present

## 2023-01-24 DIAGNOSIS — L821 Other seborrheic keratosis: Secondary | ICD-10-CM | POA: Diagnosis not present

## 2023-01-24 DIAGNOSIS — E039 Hypothyroidism, unspecified: Secondary | ICD-10-CM | POA: Diagnosis not present

## 2023-01-24 DIAGNOSIS — I700 Atherosclerosis of aorta: Secondary | ICD-10-CM | POA: Diagnosis not present

## 2023-01-24 DIAGNOSIS — N3000 Acute cystitis without hematuria: Secondary | ICD-10-CM | POA: Diagnosis not present

## 2023-01-24 DIAGNOSIS — Z0000 Encounter for general adult medical examination without abnormal findings: Secondary | ICD-10-CM | POA: Diagnosis not present

## 2023-01-24 DIAGNOSIS — F5101 Primary insomnia: Secondary | ICD-10-CM | POA: Diagnosis not present

## 2023-02-28 ENCOUNTER — Encounter: Payer: Self-pay | Admitting: Gastroenterology

## 2023-03-23 ENCOUNTER — Ambulatory Visit (AMBULATORY_SURGERY_CENTER): Payer: Medicare Other | Admitting: *Deleted

## 2023-03-23 VITALS — Ht 61.0 in | Wt 143.0 lb

## 2023-03-23 DIAGNOSIS — Z860100 Personal history of colon polyps, unspecified: Secondary | ICD-10-CM

## 2023-03-23 MED ORDER — NA SULFATE-K SULFATE-MG SULF 17.5-3.13-1.6 GM/177ML PO SOLN: 1.0000 | Freq: Once | ORAL | 0 refills | Status: AC

## 2023-03-23 NOTE — Progress Notes (Addendum)
Pt's name and DOB verified at the beginning of the pre-visit wit 2 identifiers  Pt denies any difficulty with ambulating,sitting, laying down or rolling side to side  Pt has issues with ambulation   Pt has no issues moving head neck or swallowing  No egg or soy allergy known to patient   No issues known to pt with past sedation with any surgeries or procedures  Pt denies having issues being intubated  No FH of Malignant Hyperthermia  Pt is not on diet pills or shots  Pt is not on home 02   Pt is not on blood thinners   Pt has frequent issues with constipation RN instructed pt to use Miralax per bottles instructions a week before prep days. Pt states they will  Pt is not on dialysis  Pt denise any abnormal heart rhythms   Pt denies any upcoming cardiac testing  Pt encouraged to use to use Singlecare or Goodrx to reduce cost   Patient's chart reviewed by Cathlyn Parsons CNRA prior to pre-visit and patient appropriate for the [facility].  Pre-visit completed and red dot placed by patient's name on their procedure day (on provider's schedule).  .  Visit by phone  Pt states weight is 143 lb  Instructed pt why it is important to and  to call if they have any changes in health or new medications. Directed them to the # given and on instructions.     Instructions reviewed. Pt given both [facility] main # and MD on call # prior to instructions.  Pt states understanding. Instructed to review again prior to procedure. Pt states they will.   Instructions sent by mail  and by My Chart  Coupon sent via text to mobile phone and pt verified they received it

## 2023-03-24 ENCOUNTER — Encounter: Payer: Self-pay | Admitting: Gastroenterology

## 2023-04-12 ENCOUNTER — Ambulatory Visit: Payer: Medicare Other | Admitting: Gastroenterology

## 2023-04-12 ENCOUNTER — Encounter: Payer: Self-pay | Admitting: Gastroenterology

## 2023-04-12 VITALS — BP 117/74 | HR 84 | Temp 97.3°F | Resp 16 | Ht 61.0 in | Wt 143.0 lb

## 2023-04-12 DIAGNOSIS — D120 Benign neoplasm of cecum: Secondary | ICD-10-CM | POA: Diagnosis not present

## 2023-04-12 DIAGNOSIS — Z1211 Encounter for screening for malignant neoplasm of colon: Secondary | ICD-10-CM

## 2023-04-12 DIAGNOSIS — Z860101 Personal history of adenomatous and serrated colon polyps: Secondary | ICD-10-CM

## 2023-04-12 DIAGNOSIS — Z860100 Personal history of colon polyps, unspecified: Secondary | ICD-10-CM

## 2023-04-12 MED ORDER — SODIUM CHLORIDE 0.9 % IV SOLN: 500.0000 mL | Freq: Once | INTRAVENOUS | Status: DC

## 2023-04-12 NOTE — Progress Notes (Signed)
History & Physical  Primary Care Physician:  Noberto Retort, MD Primary Gastroenterologist: Claudette Head, MD  Impression / Plan:  Personal history of adenomatous colon polyps for surveillance colonoscopy  CHIEF COMPLAINT:  Personal history of adenomatous colon polyps   HPI: Mikayla Golden is a 72 y.o. female with a personal history of adenomatous colon polyps for surveillance colonoscopy.    Past Medical History:  Diagnosis Date   Allergy    Arthritis    Cataract    GERD (gastroesophageal reflux disease)    H/O head injury 2nd grade   Hyperlipidemia    Osteoporosis    Thyroid disease     Past Surgical History:  Procedure Laterality Date   BREAST ENHANCEMENT SURGERY  1978   CATARACT EXTRACTION Bilateral    COLONOSCOPY     FLEXIBLE SIGMOIDOSCOPY N/A 06/14/2013   Procedure: FLEXIBLE SIGMOIDOSCOPY;  Surgeon: Beverley Fiedler, MD;  Location: [facility];  Service: Endoscopy;  Laterality: N/A;   head surgery     as a child   KNEE SURGERY Right 2000   POLYPECTOMY      Prior to Admission medications   Medication Sig Start Date End Date Taking? Authorizing Provider  Ascorbic Acid (VITAMIN C) 1000 MG tablet Take 1,000 mg by mouth 2 (two) times daily. PRN if pt having cold symptoms   Yes [provider]  atorvastatin (LIPITOR) 20 MG tablet Take 20 mg by mouth daily at 6 PM.    Yes [provider]  Calcium Carb-Cholecalciferol (CALCIUM 500 + D PO) Take by mouth daily.   Yes [provider]  etodolac (LODINE) 400 MG tablet Take 400 mg by mouth 2 (two) times daily. 02/10/19  Yes [provider]  gabapentin (NEURONTIN) 300 MG capsule Take 200 mg by mouth at bedtime.   Yes [provider]  levothyroxine (SYNTHROID) 25 MCG tablet Take 25 mcg by mouth every morning. 01/17/19  Yes [provider]  traZODone (DESYREL) 50 MG tablet Take 2 tablets by mouth at bedtime. 04/09/17  Yes [provider]  aspirin EC 81 MG tablet Take  81 mg by mouth daily. Patient not taking: Reported on 07/26/2022    [provider]  CASCARA SAGRADA PO Take by mouth.    [provider]  ipratropium (ATROVENT) 0.06 % nasal spray USE 2 SPRAYS IN BOTH NOSTRILS 3  TIMES DAILY AS NEEDED FOR  RHINORRHEA for 100    [provider]  omeprazole (PRILOSEC) 20 MG capsule Take 20 mg by mouth daily.    [provider]  Psyllium (METAMUCIL FIBER PO) Take by mouth 2 (two) times daily.    [provider]    Current Outpatient Medications  Medication Sig Dispense Refill   Ascorbic Acid (VITAMIN C) 1000 MG tablet Take 1,000 mg by mouth 2 (two) times daily. PRN if pt having cold symptoms     atorvastatin (LIPITOR) 20 MG tablet Take 20 mg by mouth daily at 6 PM.      Calcium Carb-Cholecalciferol (CALCIUM 500 + D PO) Take by mouth daily.     etodolac (LODINE) 400 MG tablet Take 400 mg by mouth 2 (two) times daily.     gabapentin (NEURONTIN) 300 MG capsule Take 200 mg by mouth at bedtime.     levothyroxine (SYNTHROID) 25 MCG tablet Take 25 mcg by mouth every morning.     traZODone (DESYREL) 50 MG tablet Take 2 tablets by mouth at bedtime.     aspirin EC 81  MG tablet Take 81 mg by mouth daily. (Patient not taking: Reported on 07/26/2022)     CASCARA SAGRADA PO Take by mouth.     ipratropium (ATROVENT) 0.06 % nasal spray USE 2 SPRAYS IN BOTH NOSTRILS 3  TIMES DAILY AS NEEDED FOR  RHINORRHEA for 100     omeprazole (PRILOSEC) 20 MG capsule Take 20 mg by mouth daily.     Psyllium (METAMUCIL FIBER PO) Take by mouth 2 (two) times daily.     Current Facility-Administered Medications  Medication Dose Route Frequency Provider Last Rate Last Admin   0.9 %  sodium chloride infusion  500 mL Intravenous Once Meryl Dare, MD        Allergies as of 04/12/2023 - Review Complete 04/12/2023  Allergen Reaction Noted   Flexeril [cyclobenzaprine]  05/23/2022   Metronidazole Other (See Comments) 03/23/2023   Paroxetine Other  (See Comments) 03/23/2023   Ropinirole hcl  03/23/2023   Tramadol hcl  03/23/2023    Family History  Problem Relation Age of Onset   Colon cancer Neg Hx    Esophageal cancer Neg Hx    Rectal cancer Neg Hx    Stomach cancer Neg Hx    Colon polyps Neg Hx     Social History   Socioeconomic History   Marital status: Divorced    Spouse name: Not on file   Number of children: Not on file   Years of education: Not on file   Highest education level: Not on file  Occupational History   Not on file  Tobacco Use   Smoking status: Former    Current packs/day: 0.00    Types: Cigarettes    Quit date: 05/09/1990    Years since quitting: 32.9   Smokeless tobacco: Never  Vaping Use   Vaping status: Never Used  Substance and Sexual Activity   Alcohol use: Yes    Alcohol/week: 1.0 standard drink of alcohol    Types: 1 Cans of beer per week   Drug use: No   Sexual activity: Not on file  Other Topics Concern   Not on file  Social History Narrative   Not on file   Social Determinants of Health   Financial Resource Strain: Not on file  Food Insecurity: Not on file  Transportation Needs: Not on file  Physical Activity: Not on file  Stress: Not on file  Social Connections: Not on file  Intimate Partner Violence: Not on file    Review of Systems:  All systems reviewed were negative except where noted in HPI.   Physical Exam:  General:  Alert, well-developed, in NAD Head:  Normocephalic and atraumatic. Eyes:  Sclera clear, no icterus.   Conjunctiva pink. Ears:  Normal auditory acuity. Mouth:  No deformity or lesions.  Neck:  Supple; no masses. Lungs:  Clear throughout to auscultation.   No wheezes, crackles, or rhonchi.  Heart:  Regular rate and rhythm; no murmurs. Abdomen:  Soft, nondistended, nontender. No masses, hepatomegaly. No palpable masses.  Normal bowel sounds.    Rectal:  Deferred   Msk:  Symmetrical without gross deformities. Extremities:  Without  edema. Neurologic:  Alert and  oriented x 4; grossly normal neurologically. Skin:  Intact without significant lesions or rashes. Psych:  Alert and cooperative. Normal mood and affect.   Venita Lick. Russella Dar  04/12/2023, 8:00 AM See Loretha Stapler, [facility], to contact our on call provider

## 2023-04-12 NOTE — Patient Instructions (Signed)

## 2023-04-12 NOTE — Progress Notes (Signed)
Pt's states no medical or surgical changes since previsit or office visit. 

## 2023-04-12 NOTE — Progress Notes (Signed)
Called to room to assist during endoscopic procedure.  Patient ID and intended procedure confirmed with present staff. Received instructions for my participation in the procedure from the performing physician.  

## 2023-04-12 NOTE — Progress Notes (Signed)
Vss nad trans to pacu 

## 2023-04-12 NOTE — Op Note (Signed)
[facility] Patient Name: Mikayla Golden Procedure Date: 04/12/2023 8:01 AM MRN: [id number] Endoscopist: Meryl Dare , MD, [phone] Age: 72 Referring MD:  Date of Birth: [birth year] Gender: Female Account #: [id number] Procedure:                Colonoscopy Indications:              Surveillance: Personal history of adenomatous                            polyps on last colonoscopy > 3 years ago Medicines:                Monitored Anesthesia Care Procedure:                Pre-Anesthesia Assessment:                           - Prior to the procedure, a History and Physical                            was performed, and patient medications and                            allergies were reviewed. The patient's tolerance of                            previous anesthesia was also reviewed. The risks                            and benefits of the procedure and the sedation                            options and risks were discussed with the patient.                            All questions were answered, and informed consent                            was obtained. Prior Anticoagulants: The patient has                            taken no anticoagulant or antiplatelet agents. ASA                            Grade Assessment: II - A patient with mild systemic                            disease. After reviewing the risks and benefits,                            the patient was deemed in satisfactory condition to                            undergo the procedure.  After obtaining informed consent, the colonoscope                            was passed under direct vision. Throughout the                            procedure, the patient's blood pressure, pulse, and                            oxygen saturations were monitored continuously. The                            [id number]-HQ190L Colonoscope [id number] was introduced                            through the anus and  advanced to the the cecum,                            identified by appendiceal orifice and ileocecal                            valve. The ileocecal valve, appendiceal orifice,                            and rectum were photographed. The quality of the                            bowel preparation was good. The colonoscopy was                            performed without difficulty. The patient tolerated                            the procedure well. Scope In: 8:06:55 AM Scope Out: 8:19:33 AM Scope Withdrawal Time: 0 hours 9 minutes 53 seconds  Total Procedure Duration: 0 hours 12 minutes 38 seconds  Findings:                 The perianal and digital rectal examinations were                            normal.                           A 6 mm polyp was found in the cecum. The polyp was                            sessile. The polyp was removed with a cold snare.                            Resection and retrieval were complete.                           The exam was otherwise without abnormality on  direct and retroflexion views. Complications:            No immediate complications. Estimated blood loss:                            None. Estimated Blood Loss:     Estimated blood loss: none. Impression:               - One 6 mm polyp in the cecum.                           - The examination was otherwise normal on direct                            and retroflexion views.                           - No specimens collected. Recommendation:           - Repeat colonoscopy date to be determined after                            pending pathology results are reviewed for                            surveillance based on pathology results.                           - Patient has a contact number available for                            emergencies. The signs and symptoms of potential                            delayed complications were discussed with the                             patient. Return to normal activities tomorrow.                            Written discharge instructions were provided to the                            patient.                           - Resume previous diet.                           - Continue present medications.                           - Await pathology results. Meryl Dare, MD 04/12/2023 8:24:25 AM This report has been signed electronically.

## 2023-04-13 ENCOUNTER — Telehealth: Payer: Self-pay | Admitting: *Deleted

## 2023-04-13 NOTE — Telephone Encounter (Signed)
  Follow up Call-     04/12/2023    7:08 AM  Call back number  Post procedure Call Back phone  # [phone]  Permission to leave phone message Yes     Patient questions:   Message left to call if necessary.

## 2023-04-14 LAB — SURGICAL PATHOLOGY

## 2023-04-17 ENCOUNTER — Encounter: Payer: Self-pay | Admitting: Gastroenterology

## 2023-05-02 DIAGNOSIS — K921 Melena: Secondary | ICD-10-CM | POA: Diagnosis not present

## 2023-05-02 DIAGNOSIS — K6289 Other specified diseases of anus and rectum: Secondary | ICD-10-CM | POA: Diagnosis not present

## 2023-05-02 DIAGNOSIS — K5909 Other constipation: Secondary | ICD-10-CM | POA: Diagnosis not present

## 2023-05-02 DIAGNOSIS — K624 Stenosis of anus and rectum: Secondary | ICD-10-CM | POA: Diagnosis not present

## 2023-11-16 DIAGNOSIS — T7840XA Allergy, unspecified, initial encounter: Secondary | ICD-10-CM | POA: Diagnosis not present

## 2023-11-16 DIAGNOSIS — T63481A Toxic effect of venom of other arthropod, accidental (unintentional), initial encounter: Secondary | ICD-10-CM | POA: Diagnosis not present

## 2024-01-01 DIAGNOSIS — M25561 Pain in right knee: Secondary | ICD-10-CM | POA: Diagnosis not present

## 2024-02-01 DIAGNOSIS — M545 Low back pain, unspecified: Secondary | ICD-10-CM | POA: Diagnosis not present

## 2024-02-01 DIAGNOSIS — M1711 Unilateral primary osteoarthritis, right knee: Secondary | ICD-10-CM | POA: Diagnosis not present

## 2024-02-01 DIAGNOSIS — E039 Hypothyroidism, unspecified: Secondary | ICD-10-CM | POA: Diagnosis not present

## 2024-02-01 DIAGNOSIS — Z0000 Encounter for general adult medical examination without abnormal findings: Secondary | ICD-10-CM | POA: Diagnosis not present

## 2024-02-01 DIAGNOSIS — F5101 Primary insomnia: Secondary | ICD-10-CM | POA: Diagnosis not present

## 2024-02-01 DIAGNOSIS — K219 Gastro-esophageal reflux disease without esophagitis: Secondary | ICD-10-CM | POA: Diagnosis not present

## 2024-02-01 DIAGNOSIS — E7800 Pure hypercholesterolemia, unspecified: Secondary | ICD-10-CM | POA: Diagnosis not present

## 2024-02-01 DIAGNOSIS — Z23 Encounter for immunization: Secondary | ICD-10-CM | POA: Diagnosis not present
